# Patient Record
Sex: Female | Born: 1960 | Race: White | Hispanic: No | Marital: Married | Smoking: Never smoker
Health system: Southern US, Community
[De-identification: ages and names within clinical notes are randomized; demographics above are authoritative.]

## PROBLEM LIST (undated history)

## (undated) DIAGNOSIS — Z803 Family history of malignant neoplasm of breast: Secondary | ICD-10-CM

## (undated) DIAGNOSIS — Z800 Family history of malignant neoplasm of digestive organs: Secondary | ICD-10-CM

## (undated) HISTORY — DX: Family history of malignant neoplasm of breast: Z80.3

## (undated) HISTORY — PX: NM RENAL LASIX (ARMC HX): HXRAD1213

## (undated) HISTORY — DX: Family history of malignant neoplasm of digestive organs: Z80.0

## (undated) HISTORY — PX: BREAST SURGERY: SHX581

---

## 2000-04-16 ENCOUNTER — Other Ambulatory Visit: Admission: RE | Admit: 2000-04-16 | Discharge: 2000-04-16 | Payer: Self-pay | Admitting: Family Medicine

## 2000-04-28 ENCOUNTER — Encounter: Payer: Self-pay | Admitting: Family Medicine

## 2000-04-28 ENCOUNTER — Encounter: Admission: RE | Admit: 2000-04-28 | Discharge: 2000-04-28 | Payer: Self-pay | Admitting: Family Medicine

## 2000-05-04 ENCOUNTER — Encounter: Payer: Self-pay | Admitting: Family Medicine

## 2000-05-04 ENCOUNTER — Encounter: Admission: RE | Admit: 2000-05-04 | Discharge: 2000-05-04 | Payer: Self-pay | Admitting: Family Medicine

## 2001-05-26 ENCOUNTER — Encounter: Payer: Self-pay | Admitting: Family Medicine

## 2001-05-26 ENCOUNTER — Encounter: Admission: RE | Admit: 2001-05-26 | Discharge: 2001-05-26 | Payer: Self-pay | Admitting: Family Medicine

## 2001-06-09 ENCOUNTER — Encounter: Payer: Self-pay | Admitting: Family Medicine

## 2001-06-09 ENCOUNTER — Encounter: Admission: RE | Admit: 2001-06-09 | Discharge: 2001-06-09 | Payer: Self-pay | Admitting: Family Medicine

## 2001-09-09 ENCOUNTER — Encounter: Payer: Self-pay | Admitting: Family Medicine

## 2001-09-09 ENCOUNTER — Encounter: Admission: RE | Admit: 2001-09-09 | Discharge: 2001-09-09 | Payer: Self-pay | Admitting: Family Medicine

## 2002-04-15 ENCOUNTER — Encounter: Admission: RE | Admit: 2002-04-15 | Discharge: 2002-04-15 | Payer: Self-pay | Admitting: Family Medicine

## 2002-04-15 ENCOUNTER — Encounter: Payer: Self-pay | Admitting: Family Medicine

## 2002-04-25 ENCOUNTER — Other Ambulatory Visit: Admission: RE | Admit: 2002-04-25 | Discharge: 2002-04-25 | Payer: Self-pay | Admitting: Family Medicine

## 2002-05-10 ENCOUNTER — Encounter: Payer: Self-pay | Admitting: Family Medicine

## 2002-05-10 ENCOUNTER — Encounter: Admission: RE | Admit: 2002-05-10 | Discharge: 2002-05-10 | Payer: Self-pay | Admitting: Family Medicine

## 2003-05-04 ENCOUNTER — Other Ambulatory Visit: Admission: RE | Admit: 2003-05-04 | Discharge: 2003-05-04 | Payer: Self-pay | Admitting: Family Medicine

## 2003-07-07 ENCOUNTER — Encounter: Admission: RE | Admit: 2003-07-07 | Discharge: 2003-07-07 | Payer: Self-pay | Admitting: Family Medicine

## 2004-05-06 ENCOUNTER — Other Ambulatory Visit: Admission: RE | Admit: 2004-05-06 | Discharge: 2004-05-06 | Payer: Self-pay | Admitting: Family Medicine

## 2004-08-07 ENCOUNTER — Encounter: Admission: RE | Admit: 2004-08-07 | Discharge: 2004-08-07 | Payer: Self-pay | Admitting: Family Medicine

## 2005-06-30 ENCOUNTER — Other Ambulatory Visit: Admission: RE | Admit: 2005-06-30 | Discharge: 2005-06-30 | Payer: Self-pay | Admitting: Family Medicine

## 2005-08-08 ENCOUNTER — Encounter: Admission: RE | Admit: 2005-08-08 | Discharge: 2005-08-08 | Payer: Self-pay | Admitting: Family Medicine

## 2005-09-01 ENCOUNTER — Encounter: Admission: RE | Admit: 2005-09-01 | Discharge: 2005-09-01 | Payer: Self-pay | Admitting: Sports Medicine

## 2006-02-05 ENCOUNTER — Encounter: Admission: RE | Admit: 2006-02-05 | Discharge: 2006-02-05 | Payer: Self-pay | Admitting: Family Medicine

## 2006-03-24 HISTORY — PX: LAPAROSCOPIC HYSTERECTOMY: SHX1926

## 2006-06-01 ENCOUNTER — Ambulatory Visit (HOSPITAL_COMMUNITY): Admission: RE | Admit: 2006-06-01 | Discharge: 2006-06-02 | Payer: Self-pay | Admitting: Gynecology

## 2006-06-01 ENCOUNTER — Encounter (INDEPENDENT_AMBULATORY_CARE_PROVIDER_SITE_OTHER): Payer: Self-pay | Admitting: Specialist

## 2006-12-18 ENCOUNTER — Encounter: Admission: RE | Admit: 2006-12-18 | Discharge: 2006-12-18 | Payer: Self-pay | Admitting: Family Medicine

## 2008-05-08 ENCOUNTER — Encounter: Admission: RE | Admit: 2008-05-08 | Discharge: 2008-05-08 | Payer: Self-pay | Admitting: Family Medicine

## 2008-05-15 ENCOUNTER — Encounter: Admission: RE | Admit: 2008-05-15 | Discharge: 2008-05-15 | Payer: Self-pay | Admitting: Family Medicine

## 2008-06-13 ENCOUNTER — Other Ambulatory Visit: Admission: RE | Admit: 2008-06-13 | Discharge: 2008-06-13 | Payer: Self-pay | Admitting: Family Medicine

## 2008-09-12 ENCOUNTER — Encounter: Admission: RE | Admit: 2008-09-12 | Discharge: 2008-09-12 | Payer: Self-pay | Admitting: General Surgery

## 2009-07-19 ENCOUNTER — Encounter: Admission: RE | Admit: 2009-07-19 | Discharge: 2009-07-19 | Payer: Self-pay | Admitting: Family Medicine

## 2010-04-14 ENCOUNTER — Encounter: Payer: Self-pay | Admitting: Family Medicine

## 2010-07-30 ENCOUNTER — Other Ambulatory Visit: Payer: Self-pay | Admitting: Family Medicine

## 2010-07-30 DIAGNOSIS — Z1231 Encounter for screening mammogram for malignant neoplasm of breast: Secondary | ICD-10-CM

## 2010-08-09 NOTE — Discharge Summary (Signed)
NAMEALLANNAH, Linda Jacobson            ACCOUNT NO.:  [id number]   MEDICAL RECORD NO.:  [id number]          PATIENT TYPE:  OIB   LOCATION:  9317                          FACILITY:  [facility]   PHYSICIAN:  Timothy P. Fontaine, M.D.DATE OF BIRTH:  Apr 09, 1960   DATE OF ADMISSION:  06/01/2006  DATE OF DISCHARGE:  06/02/2006                               DISCHARGE SUMMARY   DISCHARGE DIAGNOSES:  Leiomyoma, menorrhagia, dysmenorrhea, irregular  menses.   PROCEDURE:  Laparoscopic-assisted vaginal hysterectomy, June 01, 2006,  pathology pending.   HOSPITAL COURSE:  The patient underwent an uncomplicated LAVH June 01, 2006.  Her postoperative course was uncomplicated.  She was discharged  on postoperative day #1 ambulating well, tolerating a regular diet,  voiding without difficulty with a postoperative hemoglobin of 9.8.  The  patient received precautions, instructions and follow-up and is to be  seen in the office in 2 weeks following discharge. She received a  prescription for Tylox #20 one to two p.o. q.4-6h. p.r.n. pain.      Timothy P. Fontaine, M.D.  Electronically Signed     TPF/MEDQ  D:  06/02/2006  T:  06/02/2006  Job:  [id number]

## 2010-08-09 NOTE — H&P (Signed)
Linda Jacobson, Linda Jacobson            ACCOUNT NO.:  [id number]   MEDICAL RECORD NO.:  [id number]          PATIENT TYPE:  OUT   LOCATION:  SDC                           FACILITY:  [facility]   PHYSICIAN:  Timothy P. Fontaine, M.D.DATE OF BIRTH:  1960-04-30   DATE OF ADMISSION:  05/15/2006  DATE OF DISCHARGE:                              HISTORY & PHYSICAL   CHIEF COMPLAINT:  Irregular, heavy, painful menses.   HISTORY OF PRESENT ILLNESS:  A 50 year old G1, P25 female currently on  Ortho Tri-Cyclen oral contraceptives presents with progressively  worsening periods with increasing cramping and bleeding.  Outpatient  evaluation includes a normal prolactin thyroid panel, hemoglobin of 12,  and an ultrasound which shows multiple small leiomyomata.  She is  admitted at this time for laparoscopic-assisted vaginal hysterectomy.   PAST MEDICAL HISTORY:  Uncomplicated.   PAST SURGICAL HISTORY:  Cesarean section.   CURRENT MEDICATIONS:  Ortho Tri-Cyclen.   ALLERGIES:  NONE.   REVIEW OF SYSTEMS:  Noncontributory.   FAMILY HISTORY:  Noncontributory.   SOCIAL HISTORY:  Noncontributory.   PHYSICAL EXAMINATION:  VITAL SIGNS:  Afebrile, vital signs are stable.  HEENT:  Normal.  LUNGS:  Clear.  CARDIAC:  Regular rate without rubs, murmurs or gallops.  ABDOMEN:  Benign.  PELVIC:  External BUS, vagina normal.  Cervix grossly normal.  Uterus is  retroverted, mildly enlarged, irregular consistent with myomas.  Adnexa  without gross masses or tenderness.   ASSESSMENT:  A 50 year old, gravida 1 para 1 female, multiple myomas  increasing dysmenorrhea, menorrhagia, irregular menses on oral  contraceptives.   Counseled as to the options and elects for hysterectomy.  Various routes  were reviewed.  The patient elects for attempted LAVH.  The patient  understands that any time during the procedure that we may convert the  laparoscopic approach to an abdominal approach either due to  difficulties  technically or complications.  She understands and accepts  this.  The issues of ovarian conservation were also reviewed with her,  options of keeping her ovaries or removing them were reviewed and  offered.  The patient prefers to keep both ovaries.  She understands and  at the time of surgery, either due to complications or significant  disease, I may remove one or both ovaries, and she gives me permission  to do so.  She does though want to keep both ovaries.  She understands  the long-term issues with this, particularly the risk of ovarian cancer  later in life and she understands and accepts this.  The risk of benign  ovarian disease such as cysts or pain, re-operation also reviewed.  Sexuality following hysterectomy was also reviewed and the possibilities  for persistent orgasmic dysfunction or persistent dyspareunia following  the procedure was understood and accepted.  The patient understands  there is absolute irreversible sterility and she understands and accepts  this.  The acute intraoperative postoperative risks were reviewed and  the risks of infection both internal requiring prolonged antibiotics,  abscess formation requiring re-operation and abscess drainage such as  cuff abscess was discussed, understood and accepted.  Wound  complications were  also reviewed either the smaller laparoscopic  incisions or if an abdominal incision is necessary, than the larger  incisional complications was reviewed to include infection, seroma, or  hematomas requiring opening and draining of incisions, closure by  secondary intention, as well as long-term risks of hernia formation.  The risks of bleeding leading to hemorrhage necessitating transfusion  and the risks of transfusion were reviewed to include transfusion  reaction, hepatitis, HIV, mad cow disease, and other unknown entities.  The risk of inadvertent injury to internal organs including bowel,  bladder, ureters, vessels and nerves  necessitating major exploratory  reparative surgeries, future reparative surgeries including bowel  resection and ostomy formations, either immediately recognized, delay  recognized was all discussed, understood and accepted.  She again  understands that if any time during the procedure it is felt unsafe to  proceed laparoscopically or if complications arise, then we will switch  to a larger abdominal approach and she understands and accepts this.  The patient's questions were answered to her satisfaction.  She is ready  to proceed with surgery.      Timothy P. Fontaine, M.D.  Electronically Signed     TPF/MEDQ  D:  05/04/2006  T:  05/04/2006  Job:  [id number]

## 2010-08-09 NOTE — Op Note (Signed)
Linda Jacobson, Linda Jacobson            ACCOUNT NO.:  [id number]   MEDICAL RECORD NO.:  [id number]          PATIENT TYPE:  OUT   LOCATION:  [facility]C                           FACILITY:  [facility]   PHYSICIAN:  Timothy P. Fontaine, M.D.DATE OF BIRTH:  October 06, 1960   DATE OF PROCEDURE:  06/01/2006  DATE OF DISCHARGE:                               OPERATIVE REPORT   PREOPERATIVE DIAGNOSES:  1. Leiomyomata.  2. Menorrhagia.  3. Dysmenorrhea.  4. Irregular menses.   POSTOPERATIVE DIAGNOSES:  1. Leiomyomata.  2. Menorrhagia.  3. Dysmenorrhea.  4. Irregular menses.   PROCEDURE:  Laparoscopically-assisted vaginal hysterectomy.   SURGEON:  Timothy P. Fontaine, M.D.   ASSISTANT:  Rande Brunt. Eda Paschal, M.D.   ANESTHETIC:  General.   ESTIMATED BLOOD LOSS:  Approximately 150 mL.   COMPLICATIONS:  None.   SPECIMEN:  Uterus.   FINDINGS:  EUA, external BUS.  Vagina grossly normal.  Cervix normal.  Uterus retroverted and mildly enlarged.  Irregular adnexa without  masses.  Laparoscopic anterior cul-de-sac with mild vesicouterine  peritoneal fold adhesions.  Posterior cul-de-sac normal.  Uterus  retroverted, mildly enlarged, irregular in contour; consistent with  leiomyomata.  Right and left fallopian tubes normal length, caliber of  fimbriated ends.  Right and left ovaries grossly normal, free and  mobile.  Upper abdominal examination grossly normal.   PROCEDURE:  The patient was taken to the operating room and underwent  general anesthesia.  She was placed in the low dorsal lithotomy  position, received an abdominal perineal vaginal preparation with  Betadine solution.  The bladder was emptied with indwelling Foley  catheterization place in sterile technique.  EUA performed and a Hulka  tenaculum placed on the cervix.  The patient was draped in the usual  fashion.  A vertical infraumbilical incision was made.  The 11-12 direct  entry optical port was placed under direct visualization, and the  abdomen was subsequently insufflated without difficulty.  Right and left  5-mm suprapubic ports were then placed under direct visualization, after  transillumination for the vessels without difficulty.  Examination of  the pelvic organs and upper abdominal exam was carried out, with  findings noted above.  The right uterine ovarian pedicle was then  identified, grasped with the gyrus tripolar instrument. bipolar  cauterized up to a flow of 0, and sharply incised.  The right round  ligament was similarly transected with cautery, and subsequently the  parametrial planes were sharply developed.  The vesicouterine peritoneal  fold was then sharply incised to the midline.  A similar procedure was  carried out on the other side, meeting the vesicouterine fold at the  midline.  There was some minimal uterine vesicle  scarring noted, which  was relieved through sharp dissection.  Attention was then turned to the  vaginal portion of the procedure.   The patient was placed in a high dorsal lithotomy position.  The short  weighted speculum was placed within the vagina.  The Hulka tenaculum was  removed.  A regular tenaculum was placed on the cervix and the cervical  mucosa was circumferentially injected using 1% lidocaine with  epinephrine mixture (a total of 10 mL).  The cervical mucosa was then  sharply incised circumferentially and the paracervical plane sharply  developed.  The posterior cul-de-sac was then sharply entered without  difficulty.  A long weighted speculum was placed and subsequently the  right and left uterine uterosacral ligaments were identified, clamped,  cut and ligated using 0 Vicryl suture; tagged for future reference.  The  anterior bladder flap was sharply developed progressively, and the  paracervical parametrial tissues were clamped, cut and ligated using 0  Vicryl suture.  Right and left uterine vessels were identified during  this process, and ligated using 0 Vicryl  suture.  Due to the  vesicouterine scarring, it was felt safer to deliver the uterus  posteriorly to clearly identify the anterior cul-de-sac before entry.  The uterus was morcellated posteriorly, removing multiple smaller  fibroids to allow delivery of the fundus posteriorly.  With a finger  anteriorly, the vesicouterine fold was clearly identified, transected  and the anterior cul-de-sac entered.  The uterus was then removed from  the patient, through clamping of the remaining tissue attachments and  ligating using 0 Vicryl suture.  The long weighted speculum was replaced  with a shorter weighted speculum.  The posterior vaginal cuff grasped  with an Allis clamp.  The pelvis irrigated.  Adequate hemostasis was  visualized initially and a tail sponge was placed to hold the intestines  from the operative field.  The posterior vaginal cuff was then run from  uterosacral ligament to uterosacral ligament using 0 Vicryl suture in a  running interlocking stitch.  The bowel packing removed and subsequently  the vagina closed anterior to posterior, using l0 Vicryl suture in  interrupted figure-of-eight stitch.  The instruments were removed.  The  vagina copiously irrigated.   Attention was then turned back to the laparoscopic portion of the case.  The patient was placed in the low dorsal lithotomy position.  The  surgeon and assistants regloved, and the abdomen was reinsufflated.  The  pelvis was copiously irrigated.  All pedicles were reinspected, showing  adequate hemostasis.  Subsequently the gas was allowed to escape slowly,  showing adequate hemostasis under a low pressure situation.  The 5-mm  ports were removed.  Again, hemostasis visualized.  The infraumbilical  port was then backed out under direct visualization, showing adequate  hemostasis and no evidence of hernia formation.  All port sites were injected using 0.25% Marcaine.  A 0 Vicryl interrupted fascial  subcutaneous stitch was  placed infraumbilically to close the site; and,  due to some oozing of the skin incisions, 3-0 Monocryl was used.  Interrupted continuous stitches used to close both the 5 mm and the 12-  mm ports.  The patient was placed in the supine position and awakened  without difficulty.  She was taken to recovery room in good condition,  having tolerated the procedure well.      Timothy P. Fontaine, M.D.  Electronically Signed     TPF/MEDQ  D:  06/01/2006  T:  06/01/2006  Job:  [id number]

## 2010-08-23 ENCOUNTER — Ambulatory Visit
Admission: RE | Admit: 2010-08-23 | Discharge: 2010-08-23 | Disposition: A | Discharge status: Home / Self Care | Payer: BC Managed Care – PPO | Source: Ambulatory Visit | Attending: Family Medicine | Admitting: Family Medicine

## 2010-08-23 DIAGNOSIS — Z1231 Encounter for screening mammogram for malignant neoplasm of breast: Secondary | ICD-10-CM

## 2011-09-09 ENCOUNTER — Other Ambulatory Visit: Payer: Self-pay | Admitting: Family Medicine

## 2011-09-09 DIAGNOSIS — Z1231 Encounter for screening mammogram for malignant neoplasm of breast: Secondary | ICD-10-CM

## 2011-09-22 ENCOUNTER — Ambulatory Visit
Admission: RE | Admit: 2011-09-22 | Discharge: 2011-09-22 | Disposition: A | Discharge status: Home / Self Care | Payer: BC Managed Care – PPO | Source: Ambulatory Visit | Attending: Family Medicine | Admitting: Family Medicine

## 2011-09-22 DIAGNOSIS — Z1231 Encounter for screening mammogram for malignant neoplasm of breast: Secondary | ICD-10-CM

## 2012-08-23 ENCOUNTER — Other Ambulatory Visit: Payer: Self-pay

## 2012-08-23 DIAGNOSIS — Z1231 Encounter for screening mammogram for malignant neoplasm of breast: Secondary | ICD-10-CM

## 2012-10-04 ENCOUNTER — Ambulatory Visit
Admission: RE | Admit: 2012-10-04 | Discharge: 2012-10-04 | Disposition: A | Discharge status: Home / Self Care | Payer: BC Managed Care – PPO | Source: Ambulatory Visit

## 2012-10-04 DIAGNOSIS — Z1231 Encounter for screening mammogram for malignant neoplasm of breast: Secondary | ICD-10-CM

## 2014-02-27 ENCOUNTER — Other Ambulatory Visit: Payer: Self-pay

## 2014-02-27 DIAGNOSIS — Z1231 Encounter for screening mammogram for malignant neoplasm of breast: Secondary | ICD-10-CM

## 2014-03-21 ENCOUNTER — Ambulatory Visit
Admission: RE | Admit: 2014-03-21 | Discharge: 2014-03-21 | Disposition: A | Discharge status: Home / Self Care | Payer: BC Managed Care – PPO | Source: Ambulatory Visit

## 2014-03-21 DIAGNOSIS — Z1231 Encounter for screening mammogram for malignant neoplasm of breast: Secondary | ICD-10-CM

## 2015-06-15 ENCOUNTER — Other Ambulatory Visit: Payer: Self-pay

## 2015-06-15 DIAGNOSIS — Z1231 Encounter for screening mammogram for malignant neoplasm of breast: Secondary | ICD-10-CM

## 2015-07-02 ENCOUNTER — Ambulatory Visit
Admission: RE | Admit: 2015-07-02 | Discharge: 2015-07-02 | Disposition: A | Discharge status: Home / Self Care | Payer: BLUE CROSS/BLUE SHIELD | Source: Ambulatory Visit

## 2015-07-02 DIAGNOSIS — Z1231 Encounter for screening mammogram for malignant neoplasm of breast: Secondary | ICD-10-CM | POA: Diagnosis not present

## 2015-07-27 DIAGNOSIS — Z01818 Encounter for other preprocedural examination: Secondary | ICD-10-CM | POA: Diagnosis not present

## 2015-09-07 ENCOUNTER — Other Ambulatory Visit: Payer: Self-pay | Admitting: Gastroenterology

## 2015-09-07 DIAGNOSIS — K644 Residual hemorrhoidal skin tags: Secondary | ICD-10-CM | POA: Diagnosis not present

## 2015-09-07 DIAGNOSIS — D122 Benign neoplasm of ascending colon: Secondary | ICD-10-CM | POA: Diagnosis not present

## 2015-09-07 DIAGNOSIS — Z1211 Encounter for screening for malignant neoplasm of colon: Secondary | ICD-10-CM | POA: Diagnosis not present

## 2015-09-07 DIAGNOSIS — K648 Other hemorrhoids: Secondary | ICD-10-CM | POA: Diagnosis not present

## 2015-09-07 DIAGNOSIS — K5730 Diverticulosis of large intestine without perforation or abscess without bleeding: Secondary | ICD-10-CM | POA: Diagnosis not present

## 2015-10-23 DIAGNOSIS — H16002 Unspecified corneal ulcer, left eye: Secondary | ICD-10-CM | POA: Diagnosis not present

## 2015-10-23 DIAGNOSIS — H04123 Dry eye syndrome of bilateral lacrimal glands: Secondary | ICD-10-CM | POA: Diagnosis not present

## 2015-10-24 DIAGNOSIS — H168 Other keratitis: Secondary | ICD-10-CM | POA: Diagnosis not present

## 2015-10-26 DIAGNOSIS — H16002 Unspecified corneal ulcer, left eye: Secondary | ICD-10-CM | POA: Diagnosis not present

## 2015-10-26 DIAGNOSIS — H04123 Dry eye syndrome of bilateral lacrimal glands: Secondary | ICD-10-CM | POA: Diagnosis not present

## 2015-10-26 DIAGNOSIS — H168 Other keratitis: Secondary | ICD-10-CM | POA: Diagnosis not present

## 2015-10-29 DIAGNOSIS — Z9889 Other specified postprocedural states: Secondary | ICD-10-CM | POA: Insufficient documentation

## 2015-10-29 DIAGNOSIS — H168 Other keratitis: Secondary | ICD-10-CM | POA: Diagnosis not present

## 2015-10-29 DIAGNOSIS — H5213 Myopia, bilateral: Secondary | ICD-10-CM | POA: Diagnosis not present

## 2015-10-29 DIAGNOSIS — H04123 Dry eye syndrome of bilateral lacrimal glands: Secondary | ICD-10-CM | POA: Diagnosis not present

## 2015-10-29 DIAGNOSIS — H5989 Other postprocedural complications and disorders of eye and adnexa, not elsewhere classified: Secondary | ICD-10-CM | POA: Insufficient documentation

## 2016-06-12 DIAGNOSIS — G43909 Migraine, unspecified, not intractable, without status migrainosus: Secondary | ICD-10-CM | POA: Diagnosis not present

## 2016-06-12 DIAGNOSIS — N951 Menopausal and female climacteric states: Secondary | ICD-10-CM | POA: Diagnosis not present

## 2016-06-12 DIAGNOSIS — Z0000 Encounter for general adult medical examination without abnormal findings: Secondary | ICD-10-CM | POA: Diagnosis not present

## 2016-06-16 DIAGNOSIS — M531 Cervicobrachial syndrome: Secondary | ICD-10-CM | POA: Diagnosis not present

## 2016-06-16 DIAGNOSIS — M9901 Segmental and somatic dysfunction of cervical region: Secondary | ICD-10-CM | POA: Diagnosis not present

## 2016-06-16 DIAGNOSIS — M9902 Segmental and somatic dysfunction of thoracic region: Secondary | ICD-10-CM | POA: Diagnosis not present

## 2016-06-16 DIAGNOSIS — R51 Headache: Secondary | ICD-10-CM | POA: Diagnosis not present

## 2016-06-17 DIAGNOSIS — R51 Headache: Secondary | ICD-10-CM | POA: Diagnosis not present

## 2016-06-17 DIAGNOSIS — M531 Cervicobrachial syndrome: Secondary | ICD-10-CM | POA: Diagnosis not present

## 2016-06-17 DIAGNOSIS — M9901 Segmental and somatic dysfunction of cervical region: Secondary | ICD-10-CM | POA: Diagnosis not present

## 2016-06-17 DIAGNOSIS — M9902 Segmental and somatic dysfunction of thoracic region: Secondary | ICD-10-CM | POA: Diagnosis not present

## 2016-06-19 DIAGNOSIS — R51 Headache: Secondary | ICD-10-CM | POA: Diagnosis not present

## 2016-06-19 DIAGNOSIS — M9901 Segmental and somatic dysfunction of cervical region: Secondary | ICD-10-CM | POA: Diagnosis not present

## 2016-06-19 DIAGNOSIS — M9902 Segmental and somatic dysfunction of thoracic region: Secondary | ICD-10-CM | POA: Diagnosis not present

## 2016-06-19 DIAGNOSIS — M531 Cervicobrachial syndrome: Secondary | ICD-10-CM | POA: Diagnosis not present

## 2016-06-23 DIAGNOSIS — M9901 Segmental and somatic dysfunction of cervical region: Secondary | ICD-10-CM | POA: Diagnosis not present

## 2016-06-23 DIAGNOSIS — M9902 Segmental and somatic dysfunction of thoracic region: Secondary | ICD-10-CM | POA: Diagnosis not present

## 2016-06-23 DIAGNOSIS — R51 Headache: Secondary | ICD-10-CM | POA: Diagnosis not present

## 2016-06-23 DIAGNOSIS — M531 Cervicobrachial syndrome: Secondary | ICD-10-CM | POA: Diagnosis not present

## 2016-06-24 DIAGNOSIS — M9902 Segmental and somatic dysfunction of thoracic region: Secondary | ICD-10-CM | POA: Diagnosis not present

## 2016-06-24 DIAGNOSIS — M531 Cervicobrachial syndrome: Secondary | ICD-10-CM | POA: Diagnosis not present

## 2016-06-24 DIAGNOSIS — M9901 Segmental and somatic dysfunction of cervical region: Secondary | ICD-10-CM | POA: Diagnosis not present

## 2016-06-24 DIAGNOSIS — R51 Headache: Secondary | ICD-10-CM | POA: Diagnosis not present

## 2016-06-26 DIAGNOSIS — M531 Cervicobrachial syndrome: Secondary | ICD-10-CM | POA: Diagnosis not present

## 2016-06-26 DIAGNOSIS — M9902 Segmental and somatic dysfunction of thoracic region: Secondary | ICD-10-CM | POA: Diagnosis not present

## 2016-06-26 DIAGNOSIS — R51 Headache: Secondary | ICD-10-CM | POA: Diagnosis not present

## 2016-06-26 DIAGNOSIS — M9901 Segmental and somatic dysfunction of cervical region: Secondary | ICD-10-CM | POA: Diagnosis not present

## 2016-07-01 DIAGNOSIS — M9902 Segmental and somatic dysfunction of thoracic region: Secondary | ICD-10-CM | POA: Diagnosis not present

## 2016-07-01 DIAGNOSIS — M531 Cervicobrachial syndrome: Secondary | ICD-10-CM | POA: Diagnosis not present

## 2016-07-01 DIAGNOSIS — M9901 Segmental and somatic dysfunction of cervical region: Secondary | ICD-10-CM | POA: Diagnosis not present

## 2016-07-01 DIAGNOSIS — R51 Headache: Secondary | ICD-10-CM | POA: Diagnosis not present

## 2016-07-02 DIAGNOSIS — M9902 Segmental and somatic dysfunction of thoracic region: Secondary | ICD-10-CM | POA: Diagnosis not present

## 2016-07-02 DIAGNOSIS — M9901 Segmental and somatic dysfunction of cervical region: Secondary | ICD-10-CM | POA: Diagnosis not present

## 2016-07-02 DIAGNOSIS — M531 Cervicobrachial syndrome: Secondary | ICD-10-CM | POA: Diagnosis not present

## 2016-07-02 DIAGNOSIS — R51 Headache: Secondary | ICD-10-CM | POA: Diagnosis not present

## 2016-07-03 DIAGNOSIS — M9902 Segmental and somatic dysfunction of thoracic region: Secondary | ICD-10-CM | POA: Diagnosis not present

## 2016-07-03 DIAGNOSIS — M531 Cervicobrachial syndrome: Secondary | ICD-10-CM | POA: Diagnosis not present

## 2016-07-03 DIAGNOSIS — M9901 Segmental and somatic dysfunction of cervical region: Secondary | ICD-10-CM | POA: Diagnosis not present

## 2016-07-03 DIAGNOSIS — R51 Headache: Secondary | ICD-10-CM | POA: Diagnosis not present

## 2016-07-14 DIAGNOSIS — M9901 Segmental and somatic dysfunction of cervical region: Secondary | ICD-10-CM | POA: Diagnosis not present

## 2016-07-14 DIAGNOSIS — R51 Headache: Secondary | ICD-10-CM | POA: Diagnosis not present

## 2016-07-14 DIAGNOSIS — M9902 Segmental and somatic dysfunction of thoracic region: Secondary | ICD-10-CM | POA: Diagnosis not present

## 2016-07-14 DIAGNOSIS — M531 Cervicobrachial syndrome: Secondary | ICD-10-CM | POA: Diagnosis not present

## 2016-07-15 DIAGNOSIS — M531 Cervicobrachial syndrome: Secondary | ICD-10-CM | POA: Diagnosis not present

## 2016-07-15 DIAGNOSIS — M9902 Segmental and somatic dysfunction of thoracic region: Secondary | ICD-10-CM | POA: Diagnosis not present

## 2016-07-15 DIAGNOSIS — R51 Headache: Secondary | ICD-10-CM | POA: Diagnosis not present

## 2016-07-15 DIAGNOSIS — M9901 Segmental and somatic dysfunction of cervical region: Secondary | ICD-10-CM | POA: Diagnosis not present

## 2016-07-16 DIAGNOSIS — M9902 Segmental and somatic dysfunction of thoracic region: Secondary | ICD-10-CM | POA: Diagnosis not present

## 2016-07-16 DIAGNOSIS — M531 Cervicobrachial syndrome: Secondary | ICD-10-CM | POA: Diagnosis not present

## 2016-07-16 DIAGNOSIS — R51 Headache: Secondary | ICD-10-CM | POA: Diagnosis not present

## 2016-07-16 DIAGNOSIS — M9901 Segmental and somatic dysfunction of cervical region: Secondary | ICD-10-CM | POA: Diagnosis not present

## 2016-07-22 DIAGNOSIS — M531 Cervicobrachial syndrome: Secondary | ICD-10-CM | POA: Diagnosis not present

## 2016-07-22 DIAGNOSIS — M9901 Segmental and somatic dysfunction of cervical region: Secondary | ICD-10-CM | POA: Diagnosis not present

## 2016-07-22 DIAGNOSIS — R51 Headache: Secondary | ICD-10-CM | POA: Diagnosis not present

## 2016-07-22 DIAGNOSIS — M9902 Segmental and somatic dysfunction of thoracic region: Secondary | ICD-10-CM | POA: Diagnosis not present

## 2016-07-24 DIAGNOSIS — R51 Headache: Secondary | ICD-10-CM | POA: Diagnosis not present

## 2016-07-24 DIAGNOSIS — M531 Cervicobrachial syndrome: Secondary | ICD-10-CM | POA: Diagnosis not present

## 2016-07-24 DIAGNOSIS — M9902 Segmental and somatic dysfunction of thoracic region: Secondary | ICD-10-CM | POA: Diagnosis not present

## 2016-07-24 DIAGNOSIS — M9901 Segmental and somatic dysfunction of cervical region: Secondary | ICD-10-CM | POA: Diagnosis not present

## 2016-07-28 DIAGNOSIS — R51 Headache: Secondary | ICD-10-CM | POA: Diagnosis not present

## 2016-07-28 DIAGNOSIS — M531 Cervicobrachial syndrome: Secondary | ICD-10-CM | POA: Diagnosis not present

## 2016-07-28 DIAGNOSIS — M9902 Segmental and somatic dysfunction of thoracic region: Secondary | ICD-10-CM | POA: Diagnosis not present

## 2016-07-28 DIAGNOSIS — M9901 Segmental and somatic dysfunction of cervical region: Secondary | ICD-10-CM | POA: Diagnosis not present

## 2016-07-31 DIAGNOSIS — M9902 Segmental and somatic dysfunction of thoracic region: Secondary | ICD-10-CM | POA: Diagnosis not present

## 2016-07-31 DIAGNOSIS — R51 Headache: Secondary | ICD-10-CM | POA: Diagnosis not present

## 2016-07-31 DIAGNOSIS — M531 Cervicobrachial syndrome: Secondary | ICD-10-CM | POA: Diagnosis not present

## 2016-07-31 DIAGNOSIS — M9901 Segmental and somatic dysfunction of cervical region: Secondary | ICD-10-CM | POA: Diagnosis not present

## 2016-08-04 DIAGNOSIS — M9902 Segmental and somatic dysfunction of thoracic region: Secondary | ICD-10-CM | POA: Diagnosis not present

## 2016-08-04 DIAGNOSIS — R51 Headache: Secondary | ICD-10-CM | POA: Diagnosis not present

## 2016-08-04 DIAGNOSIS — M531 Cervicobrachial syndrome: Secondary | ICD-10-CM | POA: Diagnosis not present

## 2016-08-04 DIAGNOSIS — M9901 Segmental and somatic dysfunction of cervical region: Secondary | ICD-10-CM | POA: Diagnosis not present

## 2016-08-07 DIAGNOSIS — M9902 Segmental and somatic dysfunction of thoracic region: Secondary | ICD-10-CM | POA: Diagnosis not present

## 2016-08-07 DIAGNOSIS — M531 Cervicobrachial syndrome: Secondary | ICD-10-CM | POA: Diagnosis not present

## 2016-08-07 DIAGNOSIS — R51 Headache: Secondary | ICD-10-CM | POA: Diagnosis not present

## 2016-08-07 DIAGNOSIS — M9901 Segmental and somatic dysfunction of cervical region: Secondary | ICD-10-CM | POA: Diagnosis not present

## 2016-08-12 DIAGNOSIS — M531 Cervicobrachial syndrome: Secondary | ICD-10-CM | POA: Diagnosis not present

## 2016-08-12 DIAGNOSIS — M9902 Segmental and somatic dysfunction of thoracic region: Secondary | ICD-10-CM | POA: Diagnosis not present

## 2016-08-12 DIAGNOSIS — M9901 Segmental and somatic dysfunction of cervical region: Secondary | ICD-10-CM | POA: Diagnosis not present

## 2016-08-12 DIAGNOSIS — R51 Headache: Secondary | ICD-10-CM | POA: Diagnosis not present

## 2016-08-26 DIAGNOSIS — M9902 Segmental and somatic dysfunction of thoracic region: Secondary | ICD-10-CM | POA: Diagnosis not present

## 2016-08-26 DIAGNOSIS — M9901 Segmental and somatic dysfunction of cervical region: Secondary | ICD-10-CM | POA: Diagnosis not present

## 2016-08-26 DIAGNOSIS — M531 Cervicobrachial syndrome: Secondary | ICD-10-CM | POA: Diagnosis not present

## 2016-08-26 DIAGNOSIS — R51 Headache: Secondary | ICD-10-CM | POA: Diagnosis not present

## 2016-09-18 DIAGNOSIS — M9901 Segmental and somatic dysfunction of cervical region: Secondary | ICD-10-CM | POA: Diagnosis not present

## 2016-09-18 DIAGNOSIS — R51 Headache: Secondary | ICD-10-CM | POA: Diagnosis not present

## 2016-09-18 DIAGNOSIS — M531 Cervicobrachial syndrome: Secondary | ICD-10-CM | POA: Diagnosis not present

## 2016-09-18 DIAGNOSIS — M9902 Segmental and somatic dysfunction of thoracic region: Secondary | ICD-10-CM | POA: Diagnosis not present

## 2016-09-21 DIAGNOSIS — A084 Viral intestinal infection, unspecified: Secondary | ICD-10-CM | POA: Diagnosis not present

## 2016-09-21 DIAGNOSIS — E860 Dehydration: Secondary | ICD-10-CM | POA: Diagnosis not present

## 2016-10-15 DIAGNOSIS — R51 Headache: Secondary | ICD-10-CM | POA: Diagnosis not present

## 2016-10-15 DIAGNOSIS — M9902 Segmental and somatic dysfunction of thoracic region: Secondary | ICD-10-CM | POA: Diagnosis not present

## 2016-10-15 DIAGNOSIS — M9901 Segmental and somatic dysfunction of cervical region: Secondary | ICD-10-CM | POA: Diagnosis not present

## 2016-10-15 DIAGNOSIS — M531 Cervicobrachial syndrome: Secondary | ICD-10-CM | POA: Diagnosis not present

## 2016-11-03 DIAGNOSIS — M9902 Segmental and somatic dysfunction of thoracic region: Secondary | ICD-10-CM | POA: Diagnosis not present

## 2016-11-03 DIAGNOSIS — M531 Cervicobrachial syndrome: Secondary | ICD-10-CM | POA: Diagnosis not present

## 2016-11-03 DIAGNOSIS — R51 Headache: Secondary | ICD-10-CM | POA: Diagnosis not present

## 2016-11-03 DIAGNOSIS — M9901 Segmental and somatic dysfunction of cervical region: Secondary | ICD-10-CM | POA: Diagnosis not present

## 2016-11-10 DIAGNOSIS — M9901 Segmental and somatic dysfunction of cervical region: Secondary | ICD-10-CM | POA: Diagnosis not present

## 2016-11-10 DIAGNOSIS — R51 Headache: Secondary | ICD-10-CM | POA: Diagnosis not present

## 2016-11-10 DIAGNOSIS — M531 Cervicobrachial syndrome: Secondary | ICD-10-CM | POA: Diagnosis not present

## 2016-11-10 DIAGNOSIS — M9902 Segmental and somatic dysfunction of thoracic region: Secondary | ICD-10-CM | POA: Diagnosis not present

## 2016-11-25 DIAGNOSIS — M9902 Segmental and somatic dysfunction of thoracic region: Secondary | ICD-10-CM | POA: Diagnosis not present

## 2016-11-25 DIAGNOSIS — R51 Headache: Secondary | ICD-10-CM | POA: Diagnosis not present

## 2016-11-25 DIAGNOSIS — M9901 Segmental and somatic dysfunction of cervical region: Secondary | ICD-10-CM | POA: Diagnosis not present

## 2016-11-25 DIAGNOSIS — M531 Cervicobrachial syndrome: Secondary | ICD-10-CM | POA: Diagnosis not present

## 2016-12-02 DIAGNOSIS — M9901 Segmental and somatic dysfunction of cervical region: Secondary | ICD-10-CM | POA: Diagnosis not present

## 2016-12-02 DIAGNOSIS — M531 Cervicobrachial syndrome: Secondary | ICD-10-CM | POA: Diagnosis not present

## 2016-12-02 DIAGNOSIS — M9902 Segmental and somatic dysfunction of thoracic region: Secondary | ICD-10-CM | POA: Diagnosis not present

## 2016-12-02 DIAGNOSIS — R51 Headache: Secondary | ICD-10-CM | POA: Diagnosis not present

## 2016-12-09 DIAGNOSIS — M9901 Segmental and somatic dysfunction of cervical region: Secondary | ICD-10-CM | POA: Diagnosis not present

## 2016-12-09 DIAGNOSIS — M9902 Segmental and somatic dysfunction of thoracic region: Secondary | ICD-10-CM | POA: Diagnosis not present

## 2016-12-09 DIAGNOSIS — M531 Cervicobrachial syndrome: Secondary | ICD-10-CM | POA: Diagnosis not present

## 2016-12-09 DIAGNOSIS — R51 Headache: Secondary | ICD-10-CM | POA: Diagnosis not present

## 2016-12-17 DIAGNOSIS — R51 Headache: Secondary | ICD-10-CM | POA: Diagnosis not present

## 2016-12-17 DIAGNOSIS — M9902 Segmental and somatic dysfunction of thoracic region: Secondary | ICD-10-CM | POA: Diagnosis not present

## 2016-12-17 DIAGNOSIS — M531 Cervicobrachial syndrome: Secondary | ICD-10-CM | POA: Diagnosis not present

## 2016-12-17 DIAGNOSIS — M9901 Segmental and somatic dysfunction of cervical region: Secondary | ICD-10-CM | POA: Diagnosis not present

## 2016-12-24 DIAGNOSIS — M531 Cervicobrachial syndrome: Secondary | ICD-10-CM | POA: Diagnosis not present

## 2016-12-24 DIAGNOSIS — R51 Headache: Secondary | ICD-10-CM | POA: Diagnosis not present

## 2016-12-24 DIAGNOSIS — M9902 Segmental and somatic dysfunction of thoracic region: Secondary | ICD-10-CM | POA: Diagnosis not present

## 2016-12-24 DIAGNOSIS — M9901 Segmental and somatic dysfunction of cervical region: Secondary | ICD-10-CM | POA: Diagnosis not present

## 2016-12-29 DIAGNOSIS — M531 Cervicobrachial syndrome: Secondary | ICD-10-CM | POA: Diagnosis not present

## 2016-12-29 DIAGNOSIS — R51 Headache: Secondary | ICD-10-CM | POA: Diagnosis not present

## 2016-12-29 DIAGNOSIS — M9902 Segmental and somatic dysfunction of thoracic region: Secondary | ICD-10-CM | POA: Diagnosis not present

## 2016-12-29 DIAGNOSIS — M9901 Segmental and somatic dysfunction of cervical region: Secondary | ICD-10-CM | POA: Diagnosis not present

## 2017-01-02 DIAGNOSIS — N3000 Acute cystitis without hematuria: Secondary | ICD-10-CM | POA: Diagnosis not present

## 2017-01-02 DIAGNOSIS — R300 Dysuria: Secondary | ICD-10-CM | POA: Diagnosis not present

## 2017-01-07 DIAGNOSIS — M9901 Segmental and somatic dysfunction of cervical region: Secondary | ICD-10-CM | POA: Diagnosis not present

## 2017-01-07 DIAGNOSIS — M531 Cervicobrachial syndrome: Secondary | ICD-10-CM | POA: Diagnosis not present

## 2017-01-07 DIAGNOSIS — M9902 Segmental and somatic dysfunction of thoracic region: Secondary | ICD-10-CM | POA: Diagnosis not present

## 2017-01-07 DIAGNOSIS — R51 Headache: Secondary | ICD-10-CM | POA: Diagnosis not present

## 2017-01-19 ENCOUNTER — Other Ambulatory Visit: Payer: Self-pay | Admitting: Family Medicine

## 2017-01-19 DIAGNOSIS — Z139 Encounter for screening, unspecified: Secondary | ICD-10-CM

## 2017-01-22 DIAGNOSIS — R51 Headache: Secondary | ICD-10-CM | POA: Diagnosis not present

## 2017-01-22 DIAGNOSIS — M9901 Segmental and somatic dysfunction of cervical region: Secondary | ICD-10-CM | POA: Diagnosis not present

## 2017-01-22 DIAGNOSIS — M531 Cervicobrachial syndrome: Secondary | ICD-10-CM | POA: Diagnosis not present

## 2017-01-22 DIAGNOSIS — M9902 Segmental and somatic dysfunction of thoracic region: Secondary | ICD-10-CM | POA: Diagnosis not present

## 2017-02-05 DIAGNOSIS — M9902 Segmental and somatic dysfunction of thoracic region: Secondary | ICD-10-CM | POA: Diagnosis not present

## 2017-02-05 DIAGNOSIS — M531 Cervicobrachial syndrome: Secondary | ICD-10-CM | POA: Diagnosis not present

## 2017-02-05 DIAGNOSIS — M9901 Segmental and somatic dysfunction of cervical region: Secondary | ICD-10-CM | POA: Diagnosis not present

## 2017-02-05 DIAGNOSIS — R51 Headache: Secondary | ICD-10-CM | POA: Diagnosis not present

## 2017-02-16 ENCOUNTER — Ambulatory Visit
Admission: RE | Admit: 2017-02-16 | Discharge: 2017-02-16 | Disposition: A | Discharge status: Home / Self Care | Payer: BLUE CROSS/BLUE SHIELD | Source: Ambulatory Visit | Attending: Family Medicine | Admitting: Family Medicine

## 2017-02-16 DIAGNOSIS — Z1231 Encounter for screening mammogram for malignant neoplasm of breast: Secondary | ICD-10-CM | POA: Diagnosis not present

## 2017-02-16 DIAGNOSIS — Z139 Encounter for screening, unspecified: Secondary | ICD-10-CM

## 2017-02-26 DIAGNOSIS — M9901 Segmental and somatic dysfunction of cervical region: Secondary | ICD-10-CM | POA: Diagnosis not present

## 2017-02-26 DIAGNOSIS — R51 Headache: Secondary | ICD-10-CM | POA: Diagnosis not present

## 2017-02-26 DIAGNOSIS — M9902 Segmental and somatic dysfunction of thoracic region: Secondary | ICD-10-CM | POA: Diagnosis not present

## 2017-02-26 DIAGNOSIS — M531 Cervicobrachial syndrome: Secondary | ICD-10-CM | POA: Diagnosis not present

## 2017-03-12 DIAGNOSIS — R51 Headache: Secondary | ICD-10-CM | POA: Diagnosis not present

## 2017-03-12 DIAGNOSIS — M9901 Segmental and somatic dysfunction of cervical region: Secondary | ICD-10-CM | POA: Diagnosis not present

## 2017-03-12 DIAGNOSIS — M9902 Segmental and somatic dysfunction of thoracic region: Secondary | ICD-10-CM | POA: Diagnosis not present

## 2017-03-12 DIAGNOSIS — M531 Cervicobrachial syndrome: Secondary | ICD-10-CM | POA: Diagnosis not present

## 2017-04-02 DIAGNOSIS — M531 Cervicobrachial syndrome: Secondary | ICD-10-CM | POA: Diagnosis not present

## 2017-04-02 DIAGNOSIS — M9902 Segmental and somatic dysfunction of thoracic region: Secondary | ICD-10-CM | POA: Diagnosis not present

## 2017-04-02 DIAGNOSIS — M9901 Segmental and somatic dysfunction of cervical region: Secondary | ICD-10-CM | POA: Diagnosis not present

## 2017-04-02 DIAGNOSIS — R51 Headache: Secondary | ICD-10-CM | POA: Diagnosis not present

## 2017-05-04 DIAGNOSIS — M531 Cervicobrachial syndrome: Secondary | ICD-10-CM | POA: Diagnosis not present

## 2017-05-04 DIAGNOSIS — R51 Headache: Secondary | ICD-10-CM | POA: Diagnosis not present

## 2017-05-04 DIAGNOSIS — M9901 Segmental and somatic dysfunction of cervical region: Secondary | ICD-10-CM | POA: Diagnosis not present

## 2017-05-04 DIAGNOSIS — M9902 Segmental and somatic dysfunction of thoracic region: Secondary | ICD-10-CM | POA: Diagnosis not present

## 2017-05-25 DIAGNOSIS — M9901 Segmental and somatic dysfunction of cervical region: Secondary | ICD-10-CM | POA: Diagnosis not present

## 2017-05-25 DIAGNOSIS — R51 Headache: Secondary | ICD-10-CM | POA: Diagnosis not present

## 2017-05-25 DIAGNOSIS — M9902 Segmental and somatic dysfunction of thoracic region: Secondary | ICD-10-CM | POA: Diagnosis not present

## 2017-05-25 DIAGNOSIS — M531 Cervicobrachial syndrome: Secondary | ICD-10-CM | POA: Diagnosis not present

## 2017-06-15 DIAGNOSIS — M9902 Segmental and somatic dysfunction of thoracic region: Secondary | ICD-10-CM | POA: Diagnosis not present

## 2017-06-15 DIAGNOSIS — M531 Cervicobrachial syndrome: Secondary | ICD-10-CM | POA: Diagnosis not present

## 2017-06-15 DIAGNOSIS — M9901 Segmental and somatic dysfunction of cervical region: Secondary | ICD-10-CM | POA: Diagnosis not present

## 2017-06-15 DIAGNOSIS — R51 Headache: Secondary | ICD-10-CM | POA: Diagnosis not present

## 2017-06-22 DIAGNOSIS — H9190 Unspecified hearing loss, unspecified ear: Secondary | ICD-10-CM | POA: Diagnosis not present

## 2017-06-22 DIAGNOSIS — N951 Menopausal and female climacteric states: Secondary | ICD-10-CM | POA: Diagnosis not present

## 2017-06-22 DIAGNOSIS — Z0000 Encounter for general adult medical examination without abnormal findings: Secondary | ICD-10-CM | POA: Diagnosis not present

## 2017-06-22 DIAGNOSIS — G43909 Migraine, unspecified, not intractable, without status migrainosus: Secondary | ICD-10-CM | POA: Diagnosis not present

## 2017-07-06 ENCOUNTER — Other Ambulatory Visit: Payer: Self-pay | Admitting: Family Medicine

## 2017-07-06 DIAGNOSIS — D225 Melanocytic nevi of trunk: Secondary | ICD-10-CM | POA: Diagnosis not present

## 2017-07-06 DIAGNOSIS — M9902 Segmental and somatic dysfunction of thoracic region: Secondary | ICD-10-CM | POA: Diagnosis not present

## 2017-07-06 DIAGNOSIS — M531 Cervicobrachial syndrome: Secondary | ICD-10-CM | POA: Diagnosis not present

## 2017-07-06 DIAGNOSIS — M9901 Segmental and somatic dysfunction of cervical region: Secondary | ICD-10-CM | POA: Diagnosis not present

## 2017-07-06 DIAGNOSIS — R51 Headache: Secondary | ICD-10-CM | POA: Diagnosis not present

## 2017-07-27 DIAGNOSIS — M531 Cervicobrachial syndrome: Secondary | ICD-10-CM | POA: Diagnosis not present

## 2017-07-27 DIAGNOSIS — R51 Headache: Secondary | ICD-10-CM | POA: Diagnosis not present

## 2017-07-27 DIAGNOSIS — M9901 Segmental and somatic dysfunction of cervical region: Secondary | ICD-10-CM | POA: Diagnosis not present

## 2017-07-27 DIAGNOSIS — M9902 Segmental and somatic dysfunction of thoracic region: Secondary | ICD-10-CM | POA: Diagnosis not present

## 2017-08-24 DIAGNOSIS — R51 Headache: Secondary | ICD-10-CM | POA: Diagnosis not present

## 2017-08-24 DIAGNOSIS — M9901 Segmental and somatic dysfunction of cervical region: Secondary | ICD-10-CM | POA: Diagnosis not present

## 2017-08-24 DIAGNOSIS — M531 Cervicobrachial syndrome: Secondary | ICD-10-CM | POA: Diagnosis not present

## 2017-08-24 DIAGNOSIS — M9902 Segmental and somatic dysfunction of thoracic region: Secondary | ICD-10-CM | POA: Diagnosis not present

## 2017-10-05 DIAGNOSIS — M531 Cervicobrachial syndrome: Secondary | ICD-10-CM | POA: Diagnosis not present

## 2017-10-05 DIAGNOSIS — M9901 Segmental and somatic dysfunction of cervical region: Secondary | ICD-10-CM | POA: Diagnosis not present

## 2017-10-05 DIAGNOSIS — R51 Headache: Secondary | ICD-10-CM | POA: Diagnosis not present

## 2017-10-05 DIAGNOSIS — M9902 Segmental and somatic dysfunction of thoracic region: Secondary | ICD-10-CM | POA: Diagnosis not present

## 2017-10-09 DIAGNOSIS — R0789 Other chest pain: Secondary | ICD-10-CM | POA: Diagnosis not present

## 2017-11-02 DIAGNOSIS — M9901 Segmental and somatic dysfunction of cervical region: Secondary | ICD-10-CM | POA: Diagnosis not present

## 2017-11-02 DIAGNOSIS — M531 Cervicobrachial syndrome: Secondary | ICD-10-CM | POA: Diagnosis not present

## 2017-11-02 DIAGNOSIS — R51 Headache: Secondary | ICD-10-CM | POA: Diagnosis not present

## 2017-11-02 DIAGNOSIS — M9902 Segmental and somatic dysfunction of thoracic region: Secondary | ICD-10-CM | POA: Diagnosis not present

## 2017-12-07 DIAGNOSIS — M9902 Segmental and somatic dysfunction of thoracic region: Secondary | ICD-10-CM | POA: Diagnosis not present

## 2017-12-07 DIAGNOSIS — R51 Headache: Secondary | ICD-10-CM | POA: Diagnosis not present

## 2017-12-07 DIAGNOSIS — M9901 Segmental and somatic dysfunction of cervical region: Secondary | ICD-10-CM | POA: Diagnosis not present

## 2017-12-07 DIAGNOSIS — M5386 Other specified dorsopathies, lumbar region: Secondary | ICD-10-CM | POA: Diagnosis not present

## 2018-02-02 DIAGNOSIS — M9901 Segmental and somatic dysfunction of cervical region: Secondary | ICD-10-CM | POA: Diagnosis not present

## 2018-02-02 DIAGNOSIS — M9902 Segmental and somatic dysfunction of thoracic region: Secondary | ICD-10-CM | POA: Diagnosis not present

## 2018-02-02 DIAGNOSIS — R51 Headache: Secondary | ICD-10-CM | POA: Diagnosis not present

## 2018-02-02 DIAGNOSIS — M5386 Other specified dorsopathies, lumbar region: Secondary | ICD-10-CM | POA: Diagnosis not present

## 2018-03-02 DIAGNOSIS — M9902 Segmental and somatic dysfunction of thoracic region: Secondary | ICD-10-CM | POA: Diagnosis not present

## 2018-03-02 DIAGNOSIS — M9901 Segmental and somatic dysfunction of cervical region: Secondary | ICD-10-CM | POA: Diagnosis not present

## 2018-03-02 DIAGNOSIS — M5386 Other specified dorsopathies, lumbar region: Secondary | ICD-10-CM | POA: Diagnosis not present

## 2018-03-02 DIAGNOSIS — R51 Headache: Secondary | ICD-10-CM | POA: Diagnosis not present

## 2018-03-30 DIAGNOSIS — R51 Headache: Secondary | ICD-10-CM | POA: Diagnosis not present

## 2018-03-30 DIAGNOSIS — M5032 Other cervical disc degeneration, mid-cervical region, unspecified level: Secondary | ICD-10-CM | POA: Diagnosis not present

## 2018-03-30 DIAGNOSIS — M9902 Segmental and somatic dysfunction of thoracic region: Secondary | ICD-10-CM | POA: Diagnosis not present

## 2018-03-30 DIAGNOSIS — M9901 Segmental and somatic dysfunction of cervical region: Secondary | ICD-10-CM | POA: Diagnosis not present

## 2018-04-14 DIAGNOSIS — M9901 Segmental and somatic dysfunction of cervical region: Secondary | ICD-10-CM | POA: Diagnosis not present

## 2018-04-14 DIAGNOSIS — M9902 Segmental and somatic dysfunction of thoracic region: Secondary | ICD-10-CM | POA: Diagnosis not present

## 2018-04-14 DIAGNOSIS — R51 Headache: Secondary | ICD-10-CM | POA: Diagnosis not present

## 2018-04-14 DIAGNOSIS — M5032 Other cervical disc degeneration, mid-cervical region, unspecified level: Secondary | ICD-10-CM | POA: Diagnosis not present

## 2018-05-17 DIAGNOSIS — M9901 Segmental and somatic dysfunction of cervical region: Secondary | ICD-10-CM | POA: Diagnosis not present

## 2018-05-17 DIAGNOSIS — R51 Headache: Secondary | ICD-10-CM | POA: Diagnosis not present

## 2018-05-17 DIAGNOSIS — M9902 Segmental and somatic dysfunction of thoracic region: Secondary | ICD-10-CM | POA: Diagnosis not present

## 2018-05-17 DIAGNOSIS — M5032 Other cervical disc degeneration, mid-cervical region, unspecified level: Secondary | ICD-10-CM | POA: Diagnosis not present

## 2018-06-25 DIAGNOSIS — N951 Menopausal and female climacteric states: Secondary | ICD-10-CM | POA: Diagnosis not present

## 2018-06-25 DIAGNOSIS — G43909 Migraine, unspecified, not intractable, without status migrainosus: Secondary | ICD-10-CM | POA: Diagnosis not present

## 2018-06-25 DIAGNOSIS — K219 Gastro-esophageal reflux disease without esophagitis: Secondary | ICD-10-CM | POA: Diagnosis not present

## 2018-06-25 DIAGNOSIS — Z0000 Encounter for general adult medical examination without abnormal findings: Secondary | ICD-10-CM | POA: Diagnosis not present

## 2019-02-02 DIAGNOSIS — M9901 Segmental and somatic dysfunction of cervical region: Secondary | ICD-10-CM | POA: Diagnosis not present

## 2019-02-02 DIAGNOSIS — R519 Headache, unspecified: Secondary | ICD-10-CM | POA: Diagnosis not present

## 2019-02-02 DIAGNOSIS — M9902 Segmental and somatic dysfunction of thoracic region: Secondary | ICD-10-CM | POA: Diagnosis not present

## 2019-02-02 DIAGNOSIS — M5032 Other cervical disc degeneration, mid-cervical region, unspecified level: Secondary | ICD-10-CM | POA: Diagnosis not present

## 2019-02-15 DIAGNOSIS — M9901 Segmental and somatic dysfunction of cervical region: Secondary | ICD-10-CM | POA: Diagnosis not present

## 2019-02-15 DIAGNOSIS — M9902 Segmental and somatic dysfunction of thoracic region: Secondary | ICD-10-CM | POA: Diagnosis not present

## 2019-02-15 DIAGNOSIS — M5032 Other cervical disc degeneration, mid-cervical region, unspecified level: Secondary | ICD-10-CM | POA: Diagnosis not present

## 2019-02-15 DIAGNOSIS — R519 Headache, unspecified: Secondary | ICD-10-CM | POA: Diagnosis not present

## 2019-02-22 DIAGNOSIS — M9902 Segmental and somatic dysfunction of thoracic region: Secondary | ICD-10-CM | POA: Diagnosis not present

## 2019-02-22 DIAGNOSIS — R519 Headache, unspecified: Secondary | ICD-10-CM | POA: Diagnosis not present

## 2019-02-22 DIAGNOSIS — M9901 Segmental and somatic dysfunction of cervical region: Secondary | ICD-10-CM | POA: Diagnosis not present

## 2019-02-22 DIAGNOSIS — M5032 Other cervical disc degeneration, mid-cervical region, unspecified level: Secondary | ICD-10-CM | POA: Diagnosis not present

## 2019-03-01 DIAGNOSIS — M9901 Segmental and somatic dysfunction of cervical region: Secondary | ICD-10-CM | POA: Diagnosis not present

## 2019-03-01 DIAGNOSIS — R519 Headache, unspecified: Secondary | ICD-10-CM | POA: Diagnosis not present

## 2019-03-01 DIAGNOSIS — M9902 Segmental and somatic dysfunction of thoracic region: Secondary | ICD-10-CM | POA: Diagnosis not present

## 2019-03-01 DIAGNOSIS — M5032 Other cervical disc degeneration, mid-cervical region, unspecified level: Secondary | ICD-10-CM | POA: Diagnosis not present

## 2019-03-08 DIAGNOSIS — M5032 Other cervical disc degeneration, mid-cervical region, unspecified level: Secondary | ICD-10-CM | POA: Diagnosis not present

## 2019-03-08 DIAGNOSIS — M9902 Segmental and somatic dysfunction of thoracic region: Secondary | ICD-10-CM | POA: Diagnosis not present

## 2019-03-08 DIAGNOSIS — R519 Headache, unspecified: Secondary | ICD-10-CM | POA: Diagnosis not present

## 2019-03-08 DIAGNOSIS — M9901 Segmental and somatic dysfunction of cervical region: Secondary | ICD-10-CM | POA: Diagnosis not present

## 2019-03-29 DIAGNOSIS — M5032 Other cervical disc degeneration, mid-cervical region, unspecified level: Secondary | ICD-10-CM | POA: Diagnosis not present

## 2019-03-29 DIAGNOSIS — R519 Headache, unspecified: Secondary | ICD-10-CM | POA: Diagnosis not present

## 2019-03-29 DIAGNOSIS — M9901 Segmental and somatic dysfunction of cervical region: Secondary | ICD-10-CM | POA: Diagnosis not present

## 2019-03-29 DIAGNOSIS — M9902 Segmental and somatic dysfunction of thoracic region: Secondary | ICD-10-CM | POA: Diagnosis not present

## 2019-04-05 DIAGNOSIS — M9901 Segmental and somatic dysfunction of cervical region: Secondary | ICD-10-CM | POA: Diagnosis not present

## 2019-04-05 DIAGNOSIS — M5032 Other cervical disc degeneration, mid-cervical region, unspecified level: Secondary | ICD-10-CM | POA: Diagnosis not present

## 2019-04-05 DIAGNOSIS — M9902 Segmental and somatic dysfunction of thoracic region: Secondary | ICD-10-CM | POA: Diagnosis not present

## 2019-04-05 DIAGNOSIS — R519 Headache, unspecified: Secondary | ICD-10-CM | POA: Diagnosis not present

## 2019-05-03 DIAGNOSIS — R519 Headache, unspecified: Secondary | ICD-10-CM | POA: Diagnosis not present

## 2019-05-03 DIAGNOSIS — M9901 Segmental and somatic dysfunction of cervical region: Secondary | ICD-10-CM | POA: Diagnosis not present

## 2019-05-03 DIAGNOSIS — M9902 Segmental and somatic dysfunction of thoracic region: Secondary | ICD-10-CM | POA: Diagnosis not present

## 2019-05-03 DIAGNOSIS — M5032 Other cervical disc degeneration, mid-cervical region, unspecified level: Secondary | ICD-10-CM | POA: Diagnosis not present

## 2019-05-31 DIAGNOSIS — M9902 Segmental and somatic dysfunction of thoracic region: Secondary | ICD-10-CM | POA: Diagnosis not present

## 2019-05-31 DIAGNOSIS — M5032 Other cervical disc degeneration, mid-cervical region, unspecified level: Secondary | ICD-10-CM | POA: Diagnosis not present

## 2019-05-31 DIAGNOSIS — R519 Headache, unspecified: Secondary | ICD-10-CM | POA: Diagnosis not present

## 2019-05-31 DIAGNOSIS — M9901 Segmental and somatic dysfunction of cervical region: Secondary | ICD-10-CM | POA: Diagnosis not present

## 2019-06-10 ENCOUNTER — Other Ambulatory Visit: Payer: Self-pay | Admitting: Family Medicine

## 2019-06-10 DIAGNOSIS — Z1231 Encounter for screening mammogram for malignant neoplasm of breast: Secondary | ICD-10-CM

## 2019-06-15 ENCOUNTER — Other Ambulatory Visit: Payer: Self-pay

## 2019-06-15 ENCOUNTER — Ambulatory Visit
Admission: RE | Admit: 2019-06-15 | Discharge: 2019-06-15 | Disposition: A | Discharge status: Home / Self Care | Payer: BC Managed Care – PPO | Source: Ambulatory Visit

## 2019-06-15 DIAGNOSIS — Z1231 Encounter for screening mammogram for malignant neoplasm of breast: Secondary | ICD-10-CM

## 2019-06-17 ENCOUNTER — Other Ambulatory Visit: Payer: Self-pay | Admitting: Family Medicine

## 2019-06-17 DIAGNOSIS — R928 Other abnormal and inconclusive findings on diagnostic imaging of breast: Secondary | ICD-10-CM

## 2019-06-20 DIAGNOSIS — R519 Headache, unspecified: Secondary | ICD-10-CM | POA: Diagnosis not present

## 2019-06-20 DIAGNOSIS — M5032 Other cervical disc degeneration, mid-cervical region, unspecified level: Secondary | ICD-10-CM | POA: Diagnosis not present

## 2019-06-20 DIAGNOSIS — M9901 Segmental and somatic dysfunction of cervical region: Secondary | ICD-10-CM | POA: Diagnosis not present

## 2019-06-20 DIAGNOSIS — M9902 Segmental and somatic dysfunction of thoracic region: Secondary | ICD-10-CM | POA: Diagnosis not present

## 2019-06-22 ENCOUNTER — Ambulatory Visit
Admission: RE | Admit: 2019-06-22 | Discharge: 2019-06-22 | Disposition: A | Discharge status: Home / Self Care | Payer: BC Managed Care – PPO | Source: Ambulatory Visit | Attending: Family Medicine | Admitting: Family Medicine

## 2019-06-22 ENCOUNTER — Other Ambulatory Visit: Payer: Self-pay

## 2019-06-22 DIAGNOSIS — N6002 Solitary cyst of left breast: Secondary | ICD-10-CM | POA: Diagnosis not present

## 2019-06-22 DIAGNOSIS — R922 Inconclusive mammogram: Secondary | ICD-10-CM | POA: Diagnosis not present

## 2019-06-22 DIAGNOSIS — R928 Other abnormal and inconclusive findings on diagnostic imaging of breast: Secondary | ICD-10-CM

## 2019-06-27 DIAGNOSIS — Z0000 Encounter for general adult medical examination without abnormal findings: Secondary | ICD-10-CM | POA: Diagnosis not present

## 2019-06-27 DIAGNOSIS — Z131 Encounter for screening for diabetes mellitus: Secondary | ICD-10-CM | POA: Diagnosis not present

## 2019-06-27 DIAGNOSIS — Z13220 Encounter for screening for lipoid disorders: Secondary | ICD-10-CM | POA: Diagnosis not present

## 2019-07-02 ENCOUNTER — Ambulatory Visit: Payer: BC Managed Care – PPO

## 2019-07-11 DIAGNOSIS — R519 Headache, unspecified: Secondary | ICD-10-CM | POA: Diagnosis not present

## 2019-07-11 DIAGNOSIS — M9901 Segmental and somatic dysfunction of cervical region: Secondary | ICD-10-CM | POA: Diagnosis not present

## 2019-07-11 DIAGNOSIS — M5032 Other cervical disc degeneration, mid-cervical region, unspecified level: Secondary | ICD-10-CM | POA: Diagnosis not present

## 2019-07-11 DIAGNOSIS — M9902 Segmental and somatic dysfunction of thoracic region: Secondary | ICD-10-CM | POA: Diagnosis not present

## 2019-08-01 DIAGNOSIS — R519 Headache, unspecified: Secondary | ICD-10-CM | POA: Diagnosis not present

## 2019-08-01 DIAGNOSIS — M9901 Segmental and somatic dysfunction of cervical region: Secondary | ICD-10-CM | POA: Diagnosis not present

## 2019-08-01 DIAGNOSIS — M5032 Other cervical disc degeneration, mid-cervical region, unspecified level: Secondary | ICD-10-CM | POA: Diagnosis not present

## 2019-08-01 DIAGNOSIS — M9902 Segmental and somatic dysfunction of thoracic region: Secondary | ICD-10-CM | POA: Diagnosis not present

## 2019-08-13 ENCOUNTER — Ambulatory Visit: Payer: BC Managed Care – PPO | Attending: Internal Medicine

## 2019-08-13 DIAGNOSIS — Z23 Encounter for immunization: Secondary | ICD-10-CM

## 2019-08-13 NOTE — Progress Notes (Signed)
   Covid-19 Vaccination Clinic  Name:  SHEMIA BEVEL    MRN: [id number] DOB: 27-Oct-1960  08/13/2019  Ms. Tofte was observed post Covid-19 immunization for 15 minutes without incident. She was provided with Vaccine Information Sheet and instruction to access the V-Safe system.   Ms. Yim was instructed to call 911 with any severe reactions post vaccine: Marland Kitchen Difficulty breathing  . Swelling of face and throat  . A fast heartbeat  . A bad rash all over body  . Dizziness and weakness   Immunizations Administered    Name Date Dose VIS Date Route   Pfizer COVID-19 Vaccine 08/13/2019 11:12 AM 0.3 mL 05/18/2018 Intramuscular   Manufacturer: [employer], [employer]   Lot: [id number]   NDC: [address]-3

## 2019-08-29 DIAGNOSIS — M5032 Other cervical disc degeneration, mid-cervical region, unspecified level: Secondary | ICD-10-CM | POA: Diagnosis not present

## 2019-08-29 DIAGNOSIS — M9901 Segmental and somatic dysfunction of cervical region: Secondary | ICD-10-CM | POA: Diagnosis not present

## 2019-08-29 DIAGNOSIS — R519 Headache, unspecified: Secondary | ICD-10-CM | POA: Diagnosis not present

## 2019-08-29 DIAGNOSIS — M9902 Segmental and somatic dysfunction of thoracic region: Secondary | ICD-10-CM | POA: Diagnosis not present

## 2019-09-28 DIAGNOSIS — M9902 Segmental and somatic dysfunction of thoracic region: Secondary | ICD-10-CM | POA: Diagnosis not present

## 2019-09-28 DIAGNOSIS — M5032 Other cervical disc degeneration, mid-cervical region, unspecified level: Secondary | ICD-10-CM | POA: Diagnosis not present

## 2019-09-28 DIAGNOSIS — R519 Headache, unspecified: Secondary | ICD-10-CM | POA: Diagnosis not present

## 2019-09-28 DIAGNOSIS — M9901 Segmental and somatic dysfunction of cervical region: Secondary | ICD-10-CM | POA: Diagnosis not present

## 2019-10-05 ENCOUNTER — Emergency Department
Admission: EM | Admit: 2019-10-05 | Discharge: 2019-10-05 | Disposition: A | Discharge status: Home / Self Care | Payer: BC Managed Care – PPO | Source: Home / Self Care | Attending: Family Medicine | Admitting: Family Medicine

## 2019-10-05 ENCOUNTER — Other Ambulatory Visit: Payer: Self-pay

## 2019-10-05 ENCOUNTER — Emergency Department (INDEPENDENT_AMBULATORY_CARE_PROVIDER_SITE_OTHER): Payer: BC Managed Care – PPO

## 2019-10-05 DIAGNOSIS — M5412 Radiculopathy, cervical region: Secondary | ICD-10-CM | POA: Diagnosis not present

## 2019-10-05 DIAGNOSIS — R202 Paresthesia of skin: Secondary | ICD-10-CM

## 2019-10-05 DIAGNOSIS — R0789 Other chest pain: Secondary | ICD-10-CM

## 2019-10-05 DIAGNOSIS — M47812 Spondylosis without myelopathy or radiculopathy, cervical region: Secondary | ICD-10-CM | POA: Diagnosis not present

## 2019-10-05 MED ORDER — PREDNISONE 20 MG PO TABS
ORAL_TABLET | ORAL | 0 refills | Status: DC
Start: 2019-10-05 — End: 2020-09-07

## 2019-10-05 NOTE — ED Triage Notes (Signed)
Pt c/o chest tightness and RT arm tingling since yesterday. Also says radiates into neck and side of face. All started while on a work call. No hx of anxiety or panic attacks. Hx of migraines. Imitrex and tylenol this am.

## 2019-10-05 NOTE — ED Provider Notes (Signed)
[facility]    CSN: [id number] Arrival date & time: 10/05/19  1033      History   Chief Complaint Chief Complaint  Patient presents with  . Hand Pain    RT arm ache  . Chest Pain    HPI Linda Jacobson is a 59 y.o. female.   Patient reports that yesterday evening about 7pm she suddenly developed aching in her right arm and right neck, even radiating to her right face.  She recalls no injury or change in activities.  Today she felt tingling in her right arm to her right 4th and fifth fingers.  Today she also has developed a sensation of tightness in her bilateral chest radiating to her back, causing mild pain with inspiration.  She denies shortness of breath.  She states that she has had similar chest tightness four times in the past, with negative cardiac evaluation and diagnosed as panic attack.  The history is provided by the patient.    History reviewed. No pertinent past medical history.  There are no problems to display for this patient.   History reviewed. No pertinent surgical history.  OB History   No obstetric history on file.      Home Medications    Prior to Admission medications   Medication Sig Start Date End Date Taking? Authorizing Provider  estradiol (ESTRACE) 0.1 MG/GM vaginal cream Place vaginally. 10/03/19   [provider]  nortriptyline (PAMELOR) 10 MG capsule Take 10 mg by mouth 2 (two) times daily. 10/02/19   [provider]  predniSONE (DELTASONE) 20 MG tablet Take one tab by mouth twice daily for 4 days, then one daily. Take with food. 10/05/19   Lattie Haw, MD  SUMAtriptan (IMITREX) 100 MG tablet Take 100 mg by mouth as directed. 10/02/19   [provider]    Family History Family History  Problem Relation Age of Onset  . Breast cancer Sister 63  . Breast cancer Sister 26    Social History Social History   Tobacco Use  . Smoking status: Never Smoker  . Smokeless tobacco: Never Used   Vaping Use  . Vaping Use: Never used  Substance Use Topics  . Alcohol use: Yes    Comment: occ  . Drug use: Not on file     Allergies   Patient has no known allergies.   Review of Systems Review of Systems  Constitutional: Negative.   HENT: Negative.   Eyes: Negative.   Respiratory: Positive for chest tightness. Negative for cough, shortness of breath, wheezing and stridor.   Cardiovascular: Negative for palpitations and leg swelling.  Gastrointestinal: Negative.   Genitourinary: Negative.   Musculoskeletal: Positive for neck pain. Negative for arthralgias and joint swelling.  Skin: Negative.   Neurological: Positive for numbness. Negative for dizziness, tremors, seizures, syncope, facial asymmetry, speech difficulty, weakness, light-headedness and headaches.  Hematological: Negative for adenopathy.  All other systems reviewed and are negative.    Physical Exam Triage Vital Signs ED Triage Vitals  Enc Vitals Group     BP 10/05/19 1044 134/85     Pulse Rate 10/05/19 1044 88     Resp 10/05/19 1044 16     Temp 10/05/19 1044 98.6 F (37 C)     Temp Source 10/05/19 1044 Oral     SpO2 10/05/19 1044 96 %     Weight 10/05/19 1046 162 lb (73.5 kg)     Height 10/05/19 1046 5\' 4"  (1.626 m)  Head Circumference --      Peak Flow --      Pain Score 10/05/19 1201 0     Pain Loc --      Pain Edu? --      Excl. in GC? --    No data found.  Updated Vital Signs BP 134/85 (BP Location: Right Arm)   Pulse 88   Temp 98.6 F (37 C) (Oral)   Resp 16   Ht 5\' 4"  (1.626 m)   Wt 73.5 kg   SpO2 96%   BMI 27.81 kg/m   Visual Acuity Right Eye Distance:   Left Eye Distance:   Bilateral Distance:    Right Eye Near:   Left Eye Near:    Bilateral Near:     Physical Exam Vitals and nursing note reviewed.  Constitutional:      General: She is not in acute distress.    Appearance: She is not ill-appearing.  HENT:     Head: Normocephalic.     Right Ear: Tympanic membrane  normal.     Left Ear: Tympanic membrane normal.     Nose: Nose normal.     Mouth/Throat:     Pharynx: Oropharynx is clear.  Eyes:     Extraocular Movements: Extraocular movements intact.     Pupils: Pupils are equal, round, and reactive to light.  Neck:     Comments: Neck flexion to the left causes a sensation of tightness in her right shoulder. Cardiovascular:     Rate and Rhythm: Normal rate and regular rhythm.     Heart sounds: Normal heart sounds.  Pulmonary:     Breath sounds: Normal breath sounds.  Chest:     Chest wall: No tenderness.  Abdominal:     Palpations: Abdomen is soft.     Tenderness: There is no abdominal tenderness.  Musculoskeletal:     Cervical back: Normal range of motion. No rigidity or tenderness.     Right lower leg: No edema.     Left lower leg: No edema.  Lymphadenopathy:     Cervical: No cervical adenopathy.  Skin:    General: Skin is warm and dry.     Findings: No rash.  Neurological:     General: No focal deficit present.     Mental Status: She is alert and oriented to person, place, and time.     Cranial Nerves: No cranial nerve deficit.     Sensory: No sensory deficit.     Motor: No weakness.      UC Treatments / Results  Labs (all labs ordered are listed, but only abnormal results are displayed) Labs Reviewed - No data to display  EKG  Rate:  75 BPM PR:  176 msec QT:  400 msec QTcH:  446 msec QRSD:  86 msec QRS axis:  10 degrees Interpretation:  within normal limits; no acute changes   Radiology DG Cervical Spine Complete  Result Date: 10/05/2019 CLINICAL DATA:  Neck and right arm pain for the past day. No injury. EXAM: CERVICAL SPINE - COMPLETE 4+ VIEW COMPARISON:  None. FINDINGS: The lateral view is diagnostic to the C7-T1 level. There is no acute fracture or subluxation. Vertebral body heights are preserved. Reversal of the normal cervical lordosis. No listhesis. Mild disc height loss at C4-C5. Moderate disc height loss and  mild uncovertebral hypertrophy at C5-C6 and C6-C7. No significant bony neuroforaminal stenosis. Normal prevertebral soft tissues. IMPRESSION: 1. Multilevel cervical spondylosis, moderate at C5-C6 and C6-C7.  Electronically Signed   By: Obie Dredge M.D.   On: 10/05/2019 12:50    Procedures Procedures (including critical care time)  Medications Ordered in UC Medications - No data to display  Initial Impression / Assessment and Plan / UC Course  I have reviewed the triage vital signs and the nursing notes.  Pertinent labs & imaging results that were available during my care of the patient were reviewed by me and considered in my medical decision making (see chart for details).    Normal EKG reassuring. Suspect a component of anxiety causing her chest pain (she has had similar pain four times in the past). Dispensed soft cervical collar.  Begin prednisone burst/taper.  Followup with Dr. Rodney [facility] ([facility]) in about one week.    Final Clinical Impressions(s) / UC Diagnoses   Final diagnoses:  Paresthesia of right arm  Cervical radiculopathy  Atypical chest pain     Discharge Instructions     Wear soft cervical collar at bedtime.  Apply ice pack to back of neck for 20 to 30 minutes, 3 to 4 times daily  Continue until pain and decreases.  May take Tylenol as needed for pain.    ED Prescriptions    Medication Sig Dispense Auth. Provider   predniSONE (DELTASONE) 20 MG tablet Take one tab by mouth twice daily for 4 days, then one daily. Take with food. 12 tablet Lattie Haw, MD        Lattie Haw, MD 10/07/19 [phone]

## 2019-10-05 NOTE — Discharge Instructions (Addendum)
Wear soft cervical collar at bedtime.  Apply ice pack to back of neck for 20 to 30 minutes, 3 to 4 times daily  Continue until pain and decreases.  May take Tylenol as needed for pain.

## 2019-10-26 DIAGNOSIS — R519 Headache, unspecified: Secondary | ICD-10-CM | POA: Diagnosis not present

## 2019-10-26 DIAGNOSIS — M9902 Segmental and somatic dysfunction of thoracic region: Secondary | ICD-10-CM | POA: Diagnosis not present

## 2019-10-26 DIAGNOSIS — M9901 Segmental and somatic dysfunction of cervical region: Secondary | ICD-10-CM | POA: Diagnosis not present

## 2019-10-26 DIAGNOSIS — M5032 Other cervical disc degeneration, mid-cervical region, unspecified level: Secondary | ICD-10-CM | POA: Diagnosis not present

## 2019-11-14 DIAGNOSIS — F410 Panic disorder [episodic paroxysmal anxiety] without agoraphobia: Secondary | ICD-10-CM | POA: Diagnosis not present

## 2019-12-05 DIAGNOSIS — R519 Headache, unspecified: Secondary | ICD-10-CM | POA: Diagnosis not present

## 2019-12-05 DIAGNOSIS — M5032 Other cervical disc degeneration, mid-cervical region, unspecified level: Secondary | ICD-10-CM | POA: Diagnosis not present

## 2019-12-05 DIAGNOSIS — M9901 Segmental and somatic dysfunction of cervical region: Secondary | ICD-10-CM | POA: Diagnosis not present

## 2019-12-05 DIAGNOSIS — M9902 Segmental and somatic dysfunction of thoracic region: Secondary | ICD-10-CM | POA: Diagnosis not present

## 2020-01-09 DIAGNOSIS — R519 Headache, unspecified: Secondary | ICD-10-CM | POA: Diagnosis not present

## 2020-01-09 DIAGNOSIS — M9902 Segmental and somatic dysfunction of thoracic region: Secondary | ICD-10-CM | POA: Diagnosis not present

## 2020-01-09 DIAGNOSIS — M9901 Segmental and somatic dysfunction of cervical region: Secondary | ICD-10-CM | POA: Diagnosis not present

## 2020-01-09 DIAGNOSIS — M5032 Other cervical disc degeneration, mid-cervical region, unspecified level: Secondary | ICD-10-CM | POA: Diagnosis not present

## 2020-02-13 DIAGNOSIS — M9901 Segmental and somatic dysfunction of cervical region: Secondary | ICD-10-CM | POA: Diagnosis not present

## 2020-02-13 DIAGNOSIS — M5032 Other cervical disc degeneration, mid-cervical region, unspecified level: Secondary | ICD-10-CM | POA: Diagnosis not present

## 2020-02-13 DIAGNOSIS — M9902 Segmental and somatic dysfunction of thoracic region: Secondary | ICD-10-CM | POA: Diagnosis not present

## 2020-02-13 DIAGNOSIS — R519 Headache, unspecified: Secondary | ICD-10-CM | POA: Diagnosis not present

## 2020-03-06 DIAGNOSIS — D2262 Melanocytic nevi of left upper limb, including shoulder: Secondary | ICD-10-CM | POA: Diagnosis not present

## 2020-03-06 DIAGNOSIS — D1801 Hemangioma of skin and subcutaneous tissue: Secondary | ICD-10-CM | POA: Diagnosis not present

## 2020-03-06 DIAGNOSIS — D485 Neoplasm of uncertain behavior of skin: Secondary | ICD-10-CM | POA: Diagnosis not present

## 2020-03-06 DIAGNOSIS — L821 Other seborrheic keratosis: Secondary | ICD-10-CM | POA: Diagnosis not present

## 2020-03-06 DIAGNOSIS — D225 Melanocytic nevi of trunk: Secondary | ICD-10-CM | POA: Diagnosis not present

## 2020-03-20 DIAGNOSIS — U071 COVID-19: Secondary | ICD-10-CM | POA: Diagnosis not present

## 2020-04-19 DIAGNOSIS — L988 Other specified disorders of the skin and subcutaneous tissue: Secondary | ICD-10-CM | POA: Diagnosis not present

## 2020-04-19 DIAGNOSIS — D485 Neoplasm of uncertain behavior of skin: Secondary | ICD-10-CM | POA: Diagnosis not present

## 2020-06-12 DIAGNOSIS — M5032 Other cervical disc degeneration, mid-cervical region, unspecified level: Secondary | ICD-10-CM | POA: Diagnosis not present

## 2020-06-12 DIAGNOSIS — M9902 Segmental and somatic dysfunction of thoracic region: Secondary | ICD-10-CM | POA: Diagnosis not present

## 2020-06-12 DIAGNOSIS — R519 Headache, unspecified: Secondary | ICD-10-CM | POA: Diagnosis not present

## 2020-06-12 DIAGNOSIS — M9901 Segmental and somatic dysfunction of cervical region: Secondary | ICD-10-CM | POA: Diagnosis not present

## 2020-06-26 DIAGNOSIS — M9902 Segmental and somatic dysfunction of thoracic region: Secondary | ICD-10-CM | POA: Diagnosis not present

## 2020-06-26 DIAGNOSIS — M9901 Segmental and somatic dysfunction of cervical region: Secondary | ICD-10-CM | POA: Diagnosis not present

## 2020-06-26 DIAGNOSIS — R519 Headache, unspecified: Secondary | ICD-10-CM | POA: Diagnosis not present

## 2020-06-26 DIAGNOSIS — M5032 Other cervical disc degeneration, mid-cervical region, unspecified level: Secondary | ICD-10-CM | POA: Diagnosis not present

## 2020-06-27 DIAGNOSIS — Z13220 Encounter for screening for lipoid disorders: Secondary | ICD-10-CM | POA: Diagnosis not present

## 2020-06-27 DIAGNOSIS — Z131 Encounter for screening for diabetes mellitus: Secondary | ICD-10-CM | POA: Diagnosis not present

## 2020-06-27 DIAGNOSIS — Z0000 Encounter for general adult medical examination without abnormal findings: Secondary | ICD-10-CM | POA: Diagnosis not present

## 2020-07-02 ENCOUNTER — Telehealth: Payer: Self-pay | Admitting: Genetic Counselor

## 2020-07-02 DIAGNOSIS — M9901 Segmental and somatic dysfunction of cervical region: Secondary | ICD-10-CM | POA: Diagnosis not present

## 2020-07-02 DIAGNOSIS — R519 Headache, unspecified: Secondary | ICD-10-CM | POA: Diagnosis not present

## 2020-07-02 DIAGNOSIS — M9902 Segmental and somatic dysfunction of thoracic region: Secondary | ICD-10-CM | POA: Diagnosis not present

## 2020-07-02 DIAGNOSIS — M5032 Other cervical disc degeneration, mid-cervical region, unspecified level: Secondary | ICD-10-CM | POA: Diagnosis not present

## 2020-07-02 NOTE — Telephone Encounter (Signed)
Received a genetic counseling referral from Dr. Ehinger for fhx of brca. Linda Jacobson has been cld and scheduled to see [name] on 4/28 at 3pm. Pt needed an appt late in the afternoon. Aware to arrive 15 minutes early. 

## 2020-07-17 DIAGNOSIS — M5032 Other cervical disc degeneration, mid-cervical region, unspecified level: Secondary | ICD-10-CM | POA: Diagnosis not present

## 2020-07-17 DIAGNOSIS — M9902 Segmental and somatic dysfunction of thoracic region: Secondary | ICD-10-CM | POA: Diagnosis not present

## 2020-07-17 DIAGNOSIS — R519 Headache, unspecified: Secondary | ICD-10-CM | POA: Diagnosis not present

## 2020-07-17 DIAGNOSIS — M9901 Segmental and somatic dysfunction of cervical region: Secondary | ICD-10-CM | POA: Diagnosis not present

## 2020-07-19 ENCOUNTER — Inpatient Hospital Stay: Payer: BC Managed Care – PPO

## 2020-07-19 ENCOUNTER — Other Ambulatory Visit: Payer: Self-pay

## 2020-07-19 ENCOUNTER — Inpatient Hospital Stay: Payer: BC Managed Care – PPO | Attending: Genetic Counselor | Admitting: Genetic Counselor

## 2020-07-19 DIAGNOSIS — Z800 Family history of malignant neoplasm of digestive organs: Secondary | ICD-10-CM | POA: Diagnosis not present

## 2020-07-19 DIAGNOSIS — Z8481 Family history of carrier of genetic disease: Secondary | ICD-10-CM

## 2020-07-19 DIAGNOSIS — Z803 Family history of malignant neoplasm of breast: Secondary | ICD-10-CM

## 2020-07-19 LAB — GENETIC SCREENING ORDER

## 2020-07-20 ENCOUNTER — Encounter: Payer: Self-pay | Admitting: Genetic Counselor

## 2020-07-20 DIAGNOSIS — Z800 Family history of malignant neoplasm of digestive organs: Secondary | ICD-10-CM | POA: Insufficient documentation

## 2020-07-20 DIAGNOSIS — Z8481 Family history of carrier of genetic disease: Secondary | ICD-10-CM

## 2020-07-20 DIAGNOSIS — Z803 Family history of malignant neoplasm of breast: Secondary | ICD-10-CM | POA: Insufficient documentation

## 2020-07-20 HISTORY — DX: Family history of carrier of genetic disease: Z84.81

## 2020-07-20 NOTE — Progress Notes (Signed)
REFERRING PROVIDER: Gaynelle Arabian, MD [address] [place],  [place] [address]  PRIMARY PROVIDER:  Gaynelle Arabian, MD  PRIMARY REASON FOR VISIT:  1. Family history of BRCA gene mutation   2. Family history of breast cancer   3. Family history of liver cancer       HISTORY OF PRESENT ILLNESS:   Ms. Berti, a 60 y.o. female, was seen for a  cancer genetics consultation at the request of Dr. Marisue Humble due to a family history of breast cancer and a known BRCA mutation.  Ms. Hundal presents to clinic today with her husband to discuss the possibility of a hereditary predisposition to cancer, genetic testing, and to further clarify her future cancer risks, as well as potential cancer risks for family members.   Ms. Heiney does not have a personal history of cancer.    RISK FACTORS:  Menarche was at age 24-13.  First live birth at age 61.  OCP use for approximately 27 years.  Ovaries intact: yes.  Hysterectomy: yes.  Menopausal status: postmenopausal.  HRT use: 0 years. Colonoscopy: yes; 08/2015. Mammogram within the last year: yes. Number of breast biopsies: 0. Any excessive radiation exposure in the past: no  Past Medical History:  Diagnosis Date  . Family history of BRCA gene mutation 07/20/2020  . Family history of breast cancer   . Family history of liver cancer     No past surgical history on file.  Social History   Socioeconomic History  . Marital status: Married    Spouse name: Not on file  . Number of children: Not on file  . Years of education: Not on file  . Highest education level: Not on file  Occupational History  . Not on file  Tobacco Use  . Smoking status: Never Smoker  . Smokeless tobacco: Never Used  Vaping Use  . Vaping Use: Never used  Substance and Sexual Activity  . Alcohol use: Yes    Comment: occ  . Drug use: Not on file  . Sexual activity: Not on file  Other Topics Concern  . Not on file  Social History  Narrative  . Not on file   Social Determinants of Health   Financial Resource Strain: Not on file  Food Insecurity: Not on file  Transportation Needs: Not on file  Physical Activity: Not on file  Stress: Not on file  Social Connections: Not on file     FAMILY HISTORY:  We obtained a detailed, 4-generation family history.  Significant diagnoses are listed below: Family History  Problem Relation Age of Onset  . Breast cancer Sister 27  . Breast cancer Sister 77  . Liver cancer Maternal Uncle        dx >50  . Breast cancer Maternal Grandmother        unknown age of diagnosis  . BRCA 1/2 Sister        BRCA positive  . Breast cancer Paternal Grandmother        unknown age of diagnosis   Ms. Musleh has one daughter (age 13). She has one brother and six sister. Two sisters had breast cancer - one diagnosed at age 52, the other diagnosed in her 75s. Another sister had genetic testing that revealed a BRCA mutation (report not available for review).  Ms. Morten's mother died in her late 60s without cancer. There was one maternal aunt and one maternal uncle. Her uncle was diagnosed with liver cancer older than 13.  There is no known cancer among maternal cousins. Ms. Williard's maternal grandmother died in her early 70s and had breast cancer (unknown age of diagnosis). Her maternal grandfather died in his late 64s without cancer.  Ms. Christensen's father died in his late 18s without cancer. There were two paternal aunts and multiple paternal uncles. There is no known cancer among paternal aunts/uncles or paternal cousins, although she has limited information about these family members. Ms. Persons's paternal grandmother had breast cancer at an unknown age. She does not have information about her paternal grandfather.  Ms. Jeng is aware of previous family history of genetic testing for hereditary cancer risks. Patient's ancestors are of unknown descent. Ms. Newhouse is unsure if  there is Ashkenazi Jewish ancestry, although she believes it is possible because her maternal grandmother's maiden name was [place]. There is no known consanguinity.  GENETIC COUNSELING ASSESSMENT: Ms. Arington is a 60 y.o. female with a family history of a known BRCA mutation and breast cancer. We, therefore, discussed and recommended the following at today's visit.   DISCUSSION:  We discussed that Ms. Jobin has up to a 50% (1 in 2) chance to also have the BRCA variant that was discovered in her sister. We reviewed the cancer risks that are associated with BRCA mutations, including an increased risk of breast cancer, ovarian cancer, prostate cancer, female breast cancer, melanoma, and pancreatic cancer. Individuals with a BRCA mutation may opt for increased cancer screening and/or risk-reduction strategies for associated cancer risks per the NCCN guidelines.  Ms. Magouirk reports that she had inconclusive genetic testing of the BRCA genes around 2006 or 2007 (records not available for review). This may reduce but does not eliminate the chance that she has the same mutation that was identified in her sister. Additionally, there are other genes known to increase breast cancer risk that she has not had testing for, and because mutations in these genes may impact medical management, it is appropriate to pursue updated genetic testing for other hereditary breast cancer genes.   We discussed that testing is beneficial for several reasons, including knowing about other cancer risks, identifying potential screening and risk-reduction options that may be appropriate, and to understand if other family members could be at risk for cancer and allow them to undergo genetic testing. We reviewed the characteristics, features and inheritance patterns of hereditary cancer syndromes. We also discussed genetic testing, including the appropriate family members to test, the process of testing, insurance coverage and  turn-around-time for results. We discussed the implications of a negative, positive and/or variant of uncertain significant result. We recommended Ms. Wethington pursue genetic testing for the [facility] CustomNext-Expanded + RNAinsight gene panel.   The CustomNext-Cancer+RNAinsight panel offered by [facility] includes sequencing and rearrangement analysis for the following 91 genes: AIP, ALK, APC, ATM, AXIN2, BAP1, BARD1, BLM, BMPR1A, BRCA1, BRCA2, BRIP1, CDC73, CDH1, CDK4, CDKN1B, CDKN2A, CHEK2, CTNNA1, DICER1, FANCC, FH, FLCN, GALNT12, KIF1B, LZTR1, MAX, MEN1, MET, MLH1, MRE11A, MSH2, MSH3, MSH6, MUTYH, NBN, NF1, NF2, NTHL1, PALB2, PHOX2B, PMS2, POT1, PRKAR1A, PTCH1, PTEN, RAD50, RAD51C, RAD51D, RB1, RECQL, RET, SDHA, SDHAF2, SDHB, SDHC, SDHD, SMAD4, SMARCA4, SMARCB1, SMARCE1, STK11, SUFU, TMEM127, Tp53, TSC1, TSC2, VHL and XRCC2 (sequencing and deletion/duplication); CASR, CFTR, CPA1, CTRC, EGFR, EGLN1, FAM175A, HOXB13, KIT, MITF, MLH3, PALLD, PDGFRA, POLD1, POLE, PRSS1, RINT1, RPS20, SPINK1 and TERT (sequencing only); EPCAM and GREM1 (deletion/duplication only). RNA data is routinely analyzed for use in variant interpretation for all genes.   Based on Ms. Kotter's family history of  cancer and a known BRCA mutation, she meets medical criteria for genetic testing. Despite that she meets criteria, there may still be an out of pocket cost. We discussed that if her out of pocket cost for testing is over $100, the laboratory will reach out to let her know. If the out of pocket cost of testing is less than $100 she will be billed by the genetic testing laboratory.   PLAN: After considering the risks, benefits, and limitations, Ms. Purkey provided informed consent to pursue genetic testing and the blood sample was sent to [facility] for analysis of the CustomNext-Cancer + RNAinsight panel. Results should be available within approximately two-three weeks' time, at which point they will be  disclosed by telephone to Ms. Yankowski, as will any additional recommendations warranted by these results. Ms. Shambaugh will receive a summary of her genetic counseling visit and a copy of her results once available. This information will also be available in Epic.   Ms. Zell's questions were answered to her satisfaction today. Our contact information was provided should additional questions or concerns arise. Thank you for the referral and allowing [place] to share in the care of your patient.   Clint Guy, [place], [facility] Licensed, Certified Dispensing [email or web address] Phone: [phone]  The patient was seen for a total of 40 minutes in face-to-face genetic counseling.  This patient was discussed with Drs. Magrinat, Lindi Adie and/or Burr Medico who agrees with the above.    _______________________________________________________________________ For Office Staff:  Number of people involved in session: 1 Was an Intern/ student involved with case: no

## 2020-08-01 DIAGNOSIS — Z1379 Encounter for other screening for genetic and chromosomal anomalies: Secondary | ICD-10-CM | POA: Insufficient documentation

## 2020-08-02 ENCOUNTER — Telehealth: Payer: Self-pay | Admitting: Genetic Counselor

## 2020-08-02 ENCOUNTER — Encounter: Payer: Self-pay | Admitting: Genetic Counselor

## 2020-08-02 DIAGNOSIS — Z1501 Genetic susceptibility to malignant neoplasm of breast: Secondary | ICD-10-CM | POA: Insufficient documentation

## 2020-08-02 DIAGNOSIS — Z1509 Genetic susceptibility to other malignant neoplasm: Secondary | ICD-10-CM | POA: Insufficient documentation

## 2020-08-02 NOTE — Telephone Encounter (Signed)
LVM that her genetic test results are available and requested that she call back to discuss them.  

## 2020-08-02 NOTE — Telephone Encounter (Signed)
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 We scheduled a follow-up genetic counseling appointment for Monday (5/16) at 3:30 pm to review this result in greater detail.  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 This VUS should not impact her medical management at this time.

## 2020-08-06 ENCOUNTER — Inpatient Hospital Stay: Payer: BC Managed Care – PPO | Admitting: Genetic Counselor

## 2020-08-06 ENCOUNTER — Telehealth: Payer: Self-pay | Admitting: Genetic Counselor

## 2020-08-06 DIAGNOSIS — U071 COVID-19: Secondary | ICD-10-CM | POA: Diagnosis not present

## 2020-08-06 NOTE — Telephone Encounter (Signed)
Ms. Vanderpol called to cancel her genetic counseling appointment, as she is not feeling well and is undergoing testing for COVID. Offered to switch the appointment to virtual if she is feeling up to it, but Ms. Truxillo declined and said she would rather wait to come in person. She will call us back to reschedule when she is feeling better.

## 2020-08-15 ENCOUNTER — Telehealth: Payer: Self-pay | Admitting: Genetic Counselor

## 2020-08-15 NOTE — Telephone Encounter (Signed)
Rescheduled Linda Jacobson's genetic counseling appointment to Thursday, 08/23/20 at 4:00 pm.

## 2020-08-22 ENCOUNTER — Telehealth: Payer: Self-pay | Admitting: Genetic Counselor

## 2020-08-22 NOTE — Telephone Encounter (Signed)
Linda Jacobson called to ask about [facility]'s COVID testing policy, as she tested positive for COVID on 5/16 although she is currently asymptomatic. Recommended that she call the main cancer center phone number to get the most up to date information regarding COVID testing and in person appointments. She will call me back if she needs to reschedule.

## 2020-08-23 ENCOUNTER — Other Ambulatory Visit: Payer: Self-pay

## 2020-08-23 ENCOUNTER — Encounter: Payer: Self-pay | Admitting: Genetic Counselor

## 2020-08-23 ENCOUNTER — Inpatient Hospital Stay: Payer: BC Managed Care – PPO | Attending: Genetic Counselor | Admitting: Genetic Counselor

## 2020-08-23 DIAGNOSIS — Z808 Family history of malignant neoplasm of other organs or systems: Secondary | ICD-10-CM | POA: Insufficient documentation

## 2020-08-23 DIAGNOSIS — Z1379 Encounter for other screening for genetic and chromosomal anomalies: Secondary | ICD-10-CM | POA: Diagnosis not present

## 2020-08-23 DIAGNOSIS — Z803 Family history of malignant neoplasm of breast: Secondary | ICD-10-CM | POA: Insufficient documentation

## 2020-08-23 DIAGNOSIS — Z1509 Genetic susceptibility to other malignant neoplasm: Secondary | ICD-10-CM

## 2020-08-23 DIAGNOSIS — Z1501 Genetic susceptibility to malignant neoplasm of breast: Secondary | ICD-10-CM | POA: Insufficient documentation

## 2020-08-23 DIAGNOSIS — Z90710 Acquired absence of both cervix and uterus: Secondary | ICD-10-CM | POA: Insufficient documentation

## 2020-08-23 DIAGNOSIS — Z1502 Genetic susceptibility to malignant neoplasm of ovary: Secondary | ICD-10-CM

## 2020-08-24 NOTE — Progress Notes (Signed)
GENETIC TEST RESULTS   Patient Name: Linda Jacobson Patient Age: 60 y.o. Encounter Date: 08/23/2020  Referring Provider: Gaynelle Arabian, MD [address] [place],  [place] [address]    Linda Jacobson was seen in the [place] clinic on 07/19/2020 due to a family history of cancer and a known BRCA mutation, and concern regarding a hereditary predisposition to cancer in the family. Please refer to the prior Genetics clinic note for more information regarding Linda Jacobson's medical and family histories and our assessment at the time.   FAMILY HISTORY:  We obtained a detailed, 4-generation family history.  Significant diagnoses are listed below: Family History  Problem Relation Age of Onset  . Breast cancer Sister 14  . Breast cancer Sister 56  . Liver cancer Maternal Uncle        dx >50  . Breast cancer Maternal Grandmother        unknown age of diagnosis  . BRCA 1/2 Sister        BRCA positive  . Breast cancer Paternal Grandmother        unknown age of diagnosis   Linda Jacobson has one daughter (age 23). She has one brother and six sister. Two sisters had breast cancer - one diagnosed at age 54, the other diagnosed in her 64s. Another sister had genetic testing that revealed a BRCA mutation (report not available for review).  Linda Jacobson's mother died in her late 68s without cancer. There was one maternal aunt and one maternal uncle. Her uncle was diagnosed with liver cancer older than 36. There is no known cancer among maternal cousins. Ms. Lienhard's maternal grandmother died in her early 76s and had breast cancer (unknown age of diagnosis). Her maternal grandfather died in his late 88s without cancer.  Ms. Roselli's father died in his late 37s without cancer. There were two paternal aunts and multiple paternal uncles. There is no known cancer among paternal aunts/uncles or paternal cousins, although she has limited information about these family members.  Ms. Lofgren's paternal grandmother had breast cancer at an unknown age. She does not have information about her paternal grandfather.  Ms. Pinette is aware of previous family history of genetic testing for hereditary cancer risks. Patient's ancestors are of unknown descent. Ms. Mcmeekin is unsure if there is Ashkenazi Jewish ancestry, although she believes it is possible because her maternal grandmother's maiden name was [place]. There is no known consanguinity.  GENETIC TESTING: Genetic testing reported on 08/01/2020 through the [place] + RNAinisght Panel offered by [facility]. A single, heterozygous likely pathogenic variant was detected in the BRCA2 gene called p.D2723E (c.8169T>A).   The CustomNext-Cancer+RNAinsight panel offered by [facility] includes sequencing and rearrangement analysis for the following 91 genes: AIP, ALK, APC, ATM, AXIN2, BAP1, BARD1, BLM, BMPR1A, BRCA1, BRCA2, BRIP1, CDC73, CDH1, CDK4, CDKN1B, CDKN2A, CHEK2, CTNNA1, DICER1, FANCC, FH, FLCN, GALNT12, KIF1B, LZTR1, MAX, MEN1, MET, MLH1, MRE11A, MSH2, MSH3, MSH6, MUTYH, NBN, NF1, NF2, NTHL1, PALB2, PHOX2B, PMS2, POT1, PRKAR1A, PTCH1, PTEN, RAD50, RAD51C, RAD51D, RB1, RECQL, RET, SDHA, SDHAF2, SDHB, SDHC, SDHD, SMAD4, SMARCA4, SMARCB1, SMARCE1, STK11, SUFU, TMEM127, Tp53, TSC1, TSC2, VHL and XRCC2 (sequencing and deletion/duplication); CASR, CFTR, CPA1, CTRC, EGFR, EGLN1, FAM175A, HOXB13, KIT, MITF, MLH3, PALLD, PDGFRA, POLD1, POLE, PRSS1, RINT1, RPS20, SPINK1 and TERT (sequencing only); EPCAM and GREM1 (deletion/duplication only). RNA data is routinely analyzed for use in variant interpretation for all genes. The test report will be scanned into EPIC and located under the Molecular  Pathology section of the Results Review tab.  A portion of the result report is included below for reference.     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 At this time, it is unknown if this variant is associated with increased cancer risk or if this is a normal finding, but most variants such as this get reclassified to being inconsequential. It should not be used to make medical management decisions. With time, we suspect the lab will determine the significance of this variant, if any. If we do learn more about it, we will try to contact Linda Jacobson to discuss it further. However, it is important to stay in touch with [place] periodically and keep the address and phone number up to date.  CANCER RISKS: Both women and men with a BRCA2 mutation are at an increased risk for cancer. Studies show that women with a BRCA2 mutation can have a 61-77% lifetime risk to develop breast cancer, up to a 26% risk to develop a second breast cancer within 20 years, and up to a 11-25% risk to develop ovarian cancer. Men can have a 7-8% lifetime risk to develop female breast cancer and a 20-30% risk for prostate cancer. Both men and women can also have a 3-5% increased risk for pancreatic cancer and a 3-5% increased risk for melanoma.  CANCER RISK REDUCTION & SCREENING RECOMMENDATIONS: The [place] (NCCN) recommends the following for women who carry BRCA mutations: . Breast awareness starting at the age of 26 years; women should report any changes to their breasts to their health care provider. Periodic breast self-exams may facilitate breast self-awareness. . Clinical breast exams every 6-12 months, starting at age 1 years . Annual breast MRI and annual mammograms starting at age 35 years and 30 years, respectively, or individualized based on age of earliest onset of breast cancer in the family. . Consideration of risk reducing mastectomy, which reduces the risk of breast cancer by greater than 90%. . Consideration of risk reducing salpingo-oophorectomy (RRSO), ideally between the ages of 24-45, or individualized based on completion of  child-bearing years or age of earliest onset of ovarian cancer in the family. RRSO reduces the risk of ovarian cancer by greater than 90% and can reduce the risk of breast cancer by 50% if performed prior to menopause. Women who have undergone risk reducing RRSO have a small risk of peritoneal carcinoma and can consider annual CA-125 surveillance. . For women who still have their ovaries, transvaginal ultrasound and CA-125 testing every 6 months starting at age 79 years, or 5-10 years prior to the earliest age of onset of ovarian cancer in the family can be considered. Studies have not demonstrated that ovarian cancer screening is effective in detecting early ovarian cancer. . Consideration of chemoprevention options such as oral contraceptives, Tamoxifen and Raloxifene, if appropriate for an individual.  Linda Jacobson is strongly considering bilateral mastectomies, which is the most effective option available to reduce future breast cancer risk. We will plan to refer Linda Jacobson to the high-risk breast clinic to discuss her risk reduction options and establish a plan.  To reduce the risk for ovarian cancer, we recommend Linda Jacobson have a prophylactic bilateral salpingo-oophorectomy. We discussed that screening with CA-125 blood tests and transvaginal ultrasounds can be done twice per year. However, these tests have not been shown to detect ovarian cancer at an early stage. Linda Jacobson typically receives her gynecologic care from  her PCP, Dr. Marisue Humble. She will plan to also discuss the ovarian risk and surgery with the high risk providers.  The following is recommended for both men and women who carry a BRCA2 mutation: . Consideration of pancreatic cancer screening if there is a family history of pancreatic cancer . General melanoma risk management, such as annual full-body skin examination and minimizing UV exposure . Consider investigational imaging and screening studies, when available (e.g.  novel imaging technologies, more frequent screening intervals) in the context of a clinical trial  The [employer]  (NCCN) recommends the following for men who carry a BRCA2 mutation: . Breast self-exam training and education starting at age 46 years . Clinical breast exam, every 12 months, starting at age 71 years . Consider annual mammogram screening in men with gynecomastia starting at age 57, or 16 years before the earliest known female breast cancer in the family (whichever comes first) . Prostate cancer screening starting at age 22 years (PSA and digital rectal exam)  These guidelines are based on current NCCN guidelines (Genetic/Familial High-Risk Assessment: Breast, Ovarian, and Pancreatic Version 2.2022). These guidelines are continually updated and subject to change. They should be directly referenced for future medical management.  FAMILY MEMBERS: It is important that all of Linda Jacobson relatives (both men and women) know of the presence of this gene mutation. Site-specific genetic testing can sort out who in the family is at risk and who is not.  Linda Jacobson's children and siblings have a 50% chance to have inherited this mutation. We recommend they have genetic testing for this same mutation, as identifying the presence of this mutation would allow them to also take advantage of risk-reducing measures.   Additionally, individuals with a pathogenic variant in BRCA2 are carriers of a rare genetic condition called Fanconi anemia. Fanconi anemia is an autosomal recessive disorder that is characterized by bone marrow failure and variable presentation of anomalies, including short stature, abnormal skin pigmentation, abnormal thumbs, malformations of the skeletal and central nervous systems, and developmental delay. Risks for leukemia and early onset solid tumors are significantly elevated. For there to be a risk of Fanconi anemia in offspring, both parents would  each have to have a single pathogenic variant in BRCA2; in such a case, the risk of having an affected child is 25%.  SUPPORT AND RESOURCES: If Linda Jacobson is interested in BRCA-specific information and support, there are two groups, Facing Our Risk (www.facingourrisk.org) and Bright Pink (www.brightpink.org) which some people have found useful. They provide opportunities to speak with other individuals from high-risk families. To locate genetic counselors in other cities, visit the website www.FindAGeneticCounselor.com and search for a counselor by zip code.  We encouraged Linda Jacobson to remain in contact with [place] on an annual basis so we can update her personal and family histories, and let her know of advances in cancer genetics that may benefit the family. Our contact number was provided. Linda Jacobson's questions were answered to her satisfaction today, and she knows she is welcome to call anytime with additional questions.   Clint Guy, [place], [facility] Licensed, Certified Dispensing [email or web address] phone: [phone]  The patient was seen for a total of 35 minutes in face-to-face genetic counseling. She was accompanied by her husband, Linda Jacobson.

## 2020-08-28 ENCOUNTER — Telehealth: Payer: Self-pay | Admitting: Hematology and Oncology

## 2020-08-28 DIAGNOSIS — R519 Headache, unspecified: Secondary | ICD-10-CM | POA: Diagnosis not present

## 2020-08-28 DIAGNOSIS — M5032 Other cervical disc degeneration, mid-cervical region, unspecified level: Secondary | ICD-10-CM | POA: Diagnosis not present

## 2020-08-28 DIAGNOSIS — M9902 Segmental and somatic dysfunction of thoracic region: Secondary | ICD-10-CM | POA: Diagnosis not present

## 2020-08-28 DIAGNOSIS — M9901 Segmental and somatic dysfunction of cervical region: Secondary | ICD-10-CM | POA: Diagnosis not present

## 2020-08-28 NOTE — Telephone Encounter (Signed)
Scheduled appt per 6/6 referral. Called pt, no answer. Left msg with appt date and time.

## 2020-08-29 ENCOUNTER — Telehealth: Payer: Self-pay | Admitting: Hematology and Oncology

## 2020-08-29 NOTE — Telephone Encounter (Signed)
Pt called in to r/s appt. R/s appt to next week per pt request.

## 2020-08-30 ENCOUNTER — Encounter: Payer: BC Managed Care – PPO | Admitting: Hematology and Oncology

## 2020-09-06 IMAGING — MG MM DIGITAL DIAGNOSTIC UNILAT*L* W/ TOMO W/ CAD
4 series · 4 of 12 positions shown · non-contrast
Comparison: Previous exam(s).

CLINICAL DATA: Possible mass in the lateral retroareolar left
breast on a recent screening mammogram.

EXAM:
DIGITAL DIAGNOSTIC LEFT MAMMOGRAM WITH TOMO
ULTRASOUND LEFT BREAST

[L CC synth-2D]
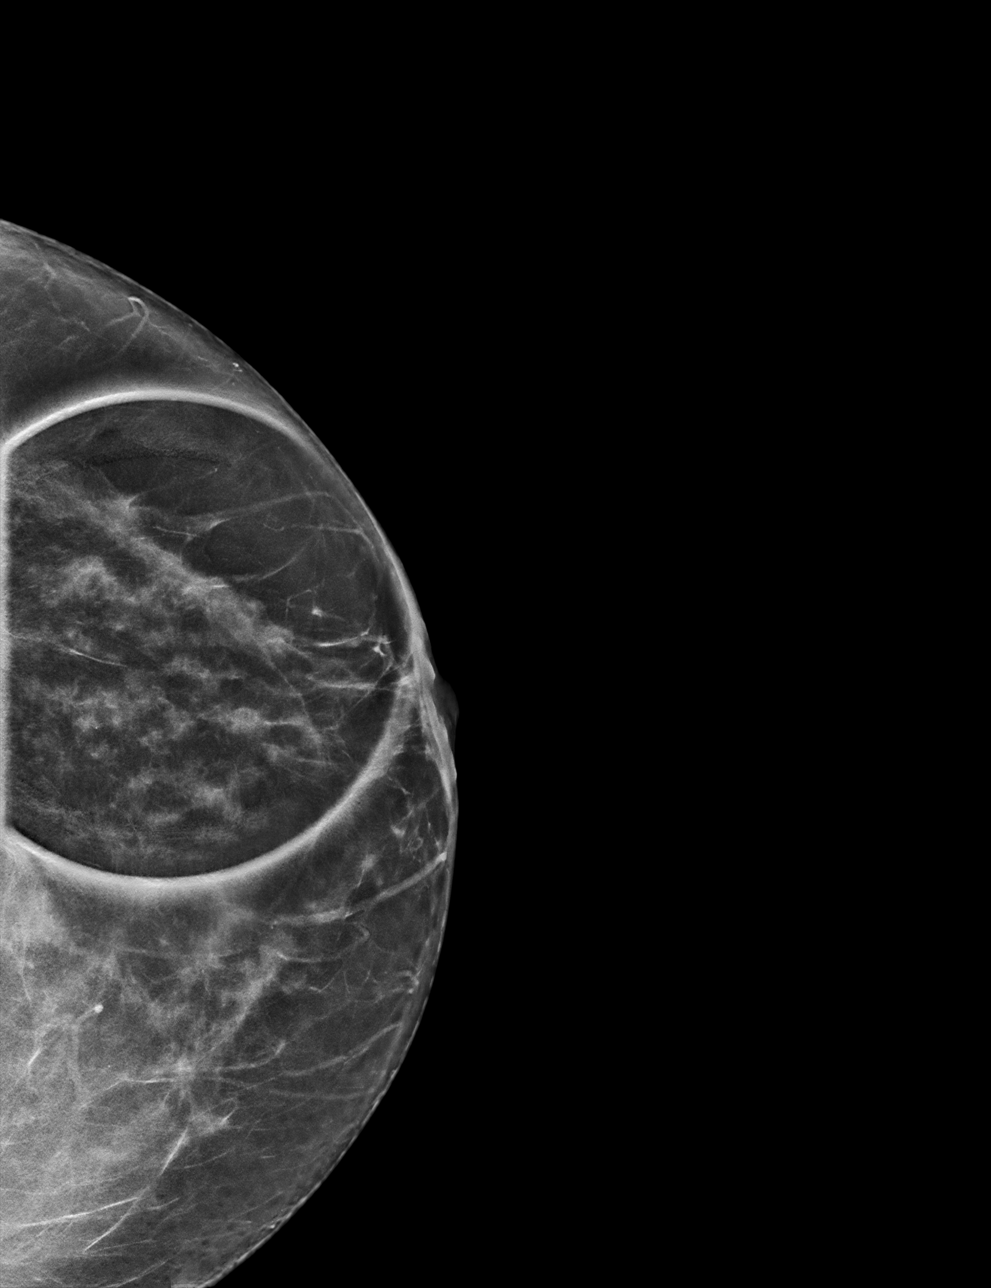

[L MLO synth-2D]
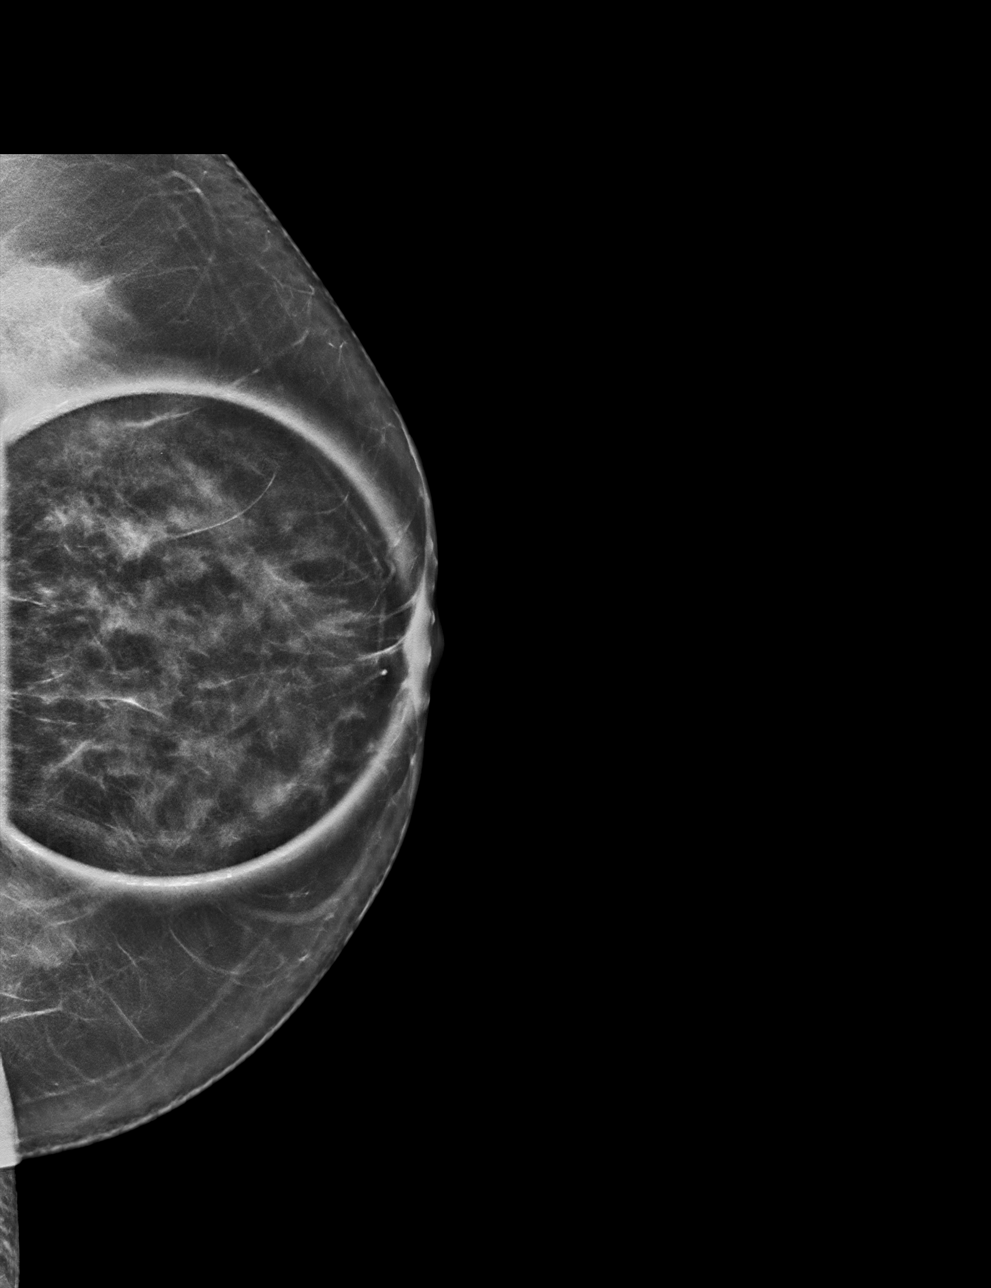

[L MLO tomo · tomo slice 36/71.0]
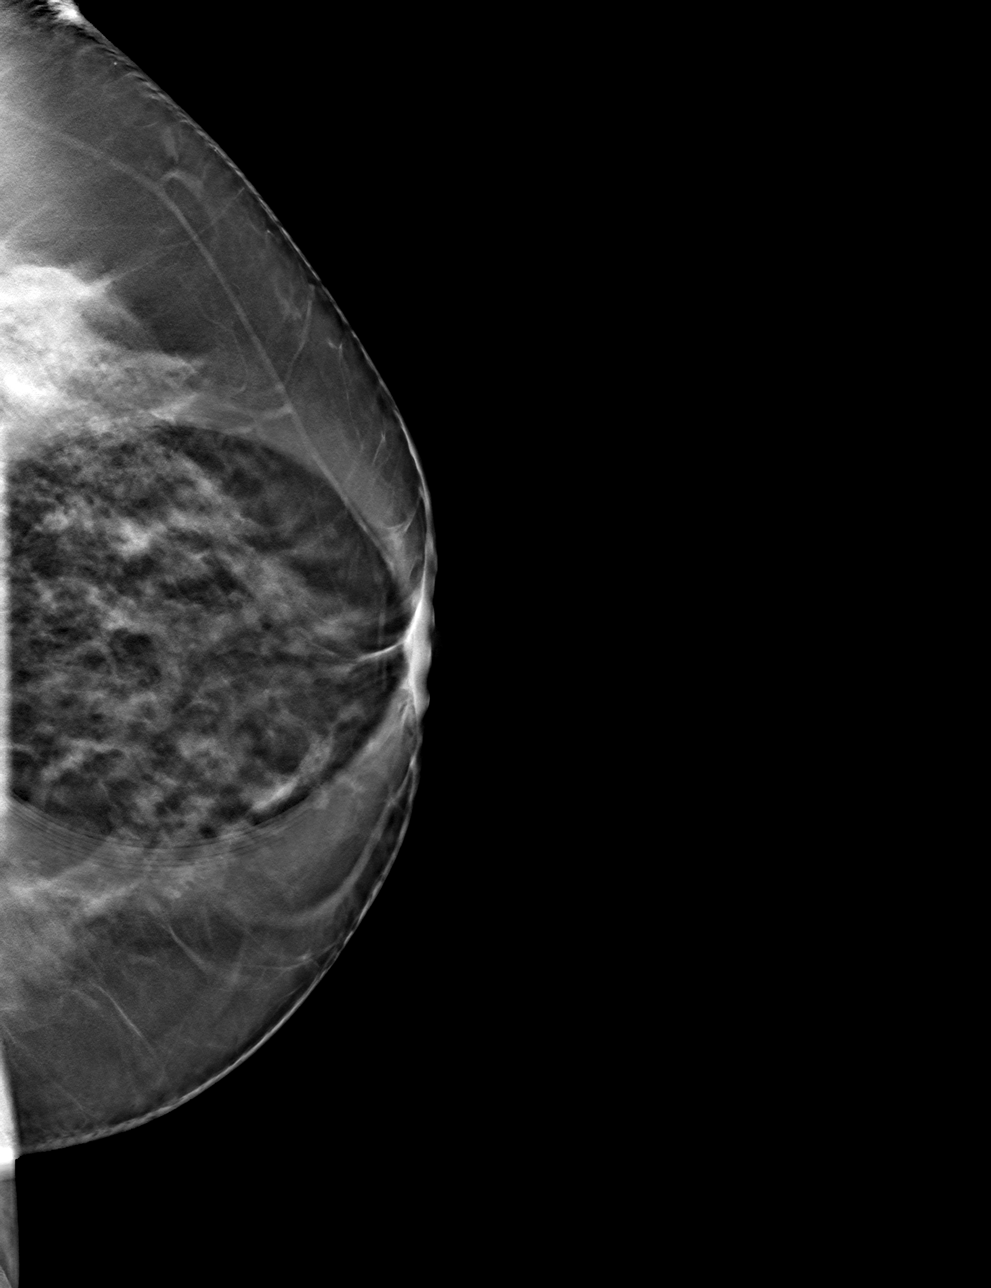

[L CC tomo · tomo slice 33/64.0]
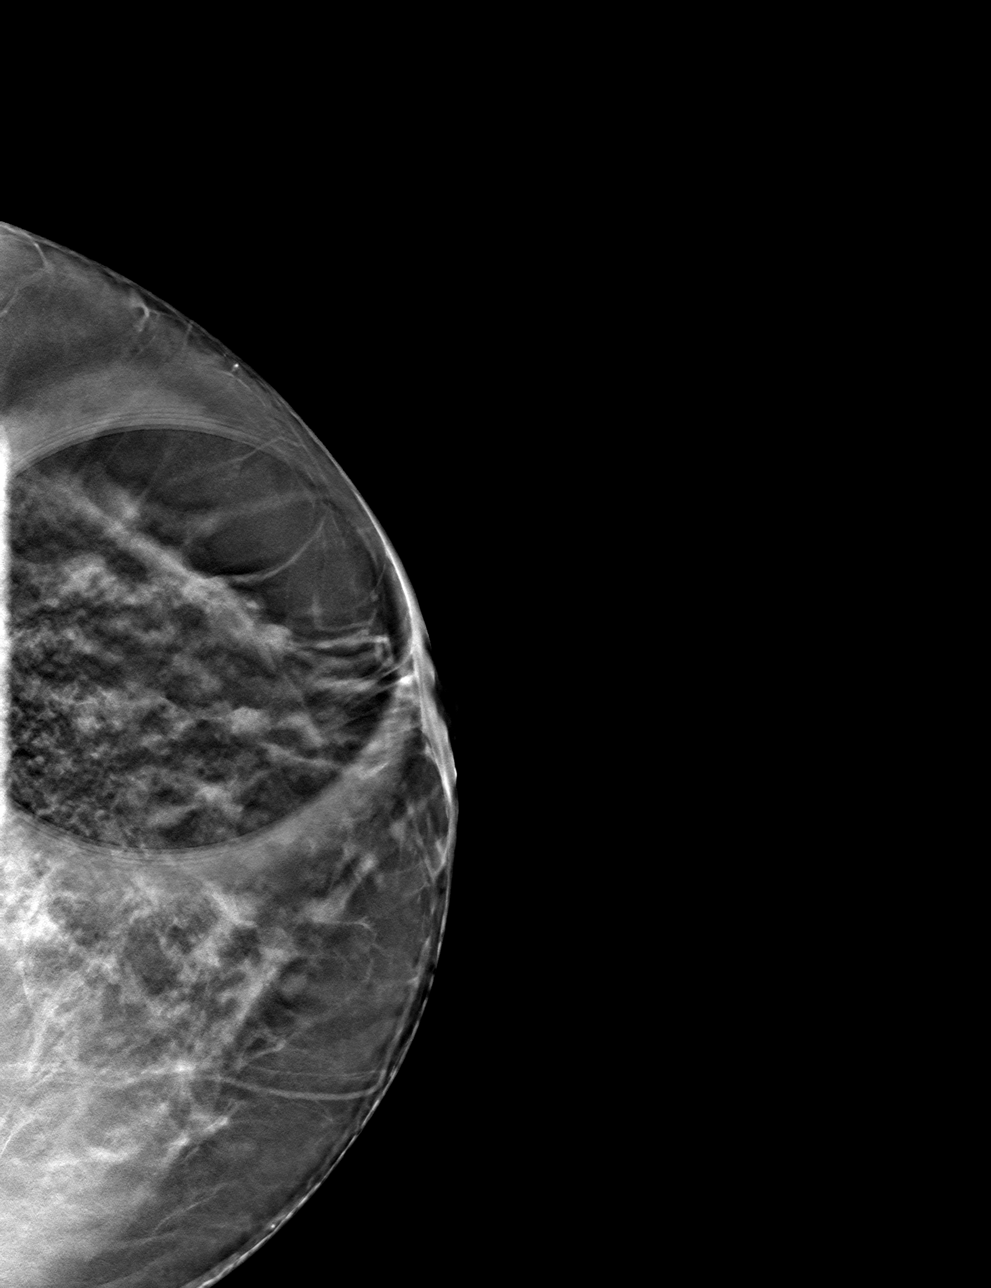

[4 of 12 positions shown; findings below may reference images not displayed]

ACR Breast Density Category c: The breast tissue is heterogeneously
dense, which may obscure small masses.
FINDINGS: 3D tomographic and 2D generated spot compression views of the left
breast demonstrate normal appearing fibroglandular tissue at the
location of the recently suspected mass.

Targeted ultrasound is performed, showing an 8 mm cyst with
low-level internal echoes in the 2 o'clock position of the left
breast, 2 cm from the nipple. No internal blood flow was seen with
power Doppler.
IMPRESSION: Benign left breast cyst.  No evidence of malignancy.

RECOMMENDATION:
Bilateral screening mammogram in 1 year.

I have discussed the findings and recommendations with the patient.
If applicable, a reminder letter will be sent to the patient
regarding the next appointment.

BI-RADS CATEGORY  2: Benign.

## 2020-09-06 IMAGING — US US BREAST*L* LIMITED INC AXILLA
1 series · 5 of 5 positions shown · non-contrast
Comparison: Previous exam(s).

CLINICAL DATA: Possible mass in the lateral retroareolar left
breast on a recent screening mammogram.

EXAM:
DIGITAL DIAGNOSTIC LEFT MAMMOGRAM WITH TOMO
ULTRASOUND LEFT BREAST

[Series 1: us breast*left* limited inc axilla · 0.07mm/px · 5 of 5 slices shown]
[im 1/5]
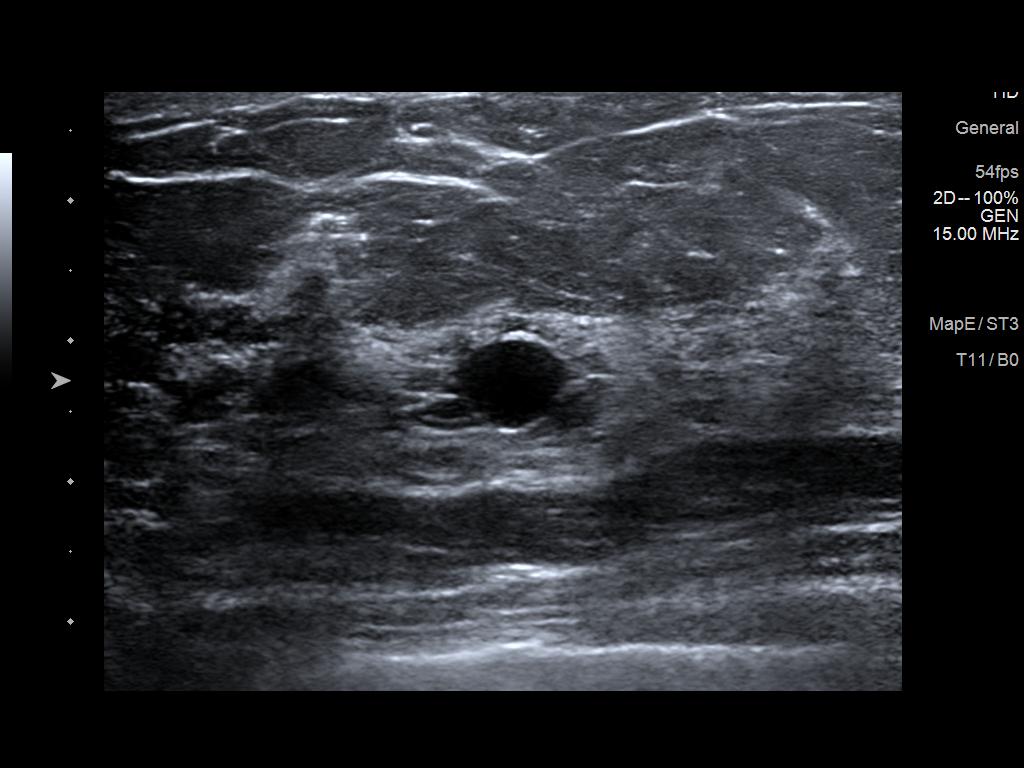
[im 2/5]
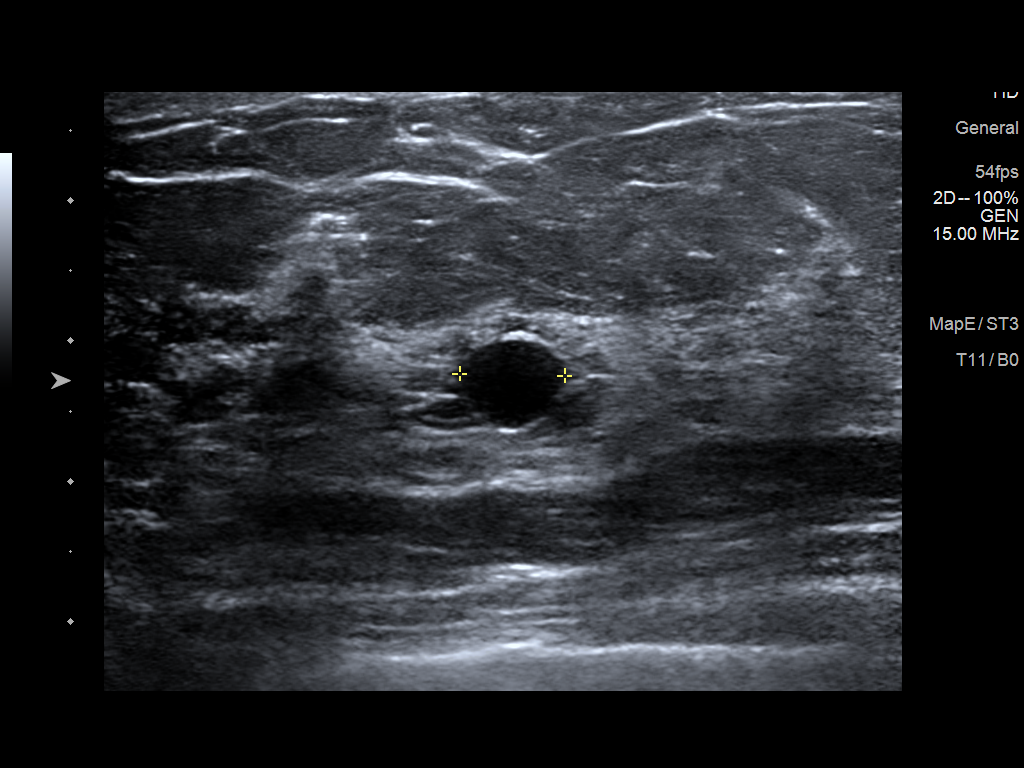
[im 3/5]
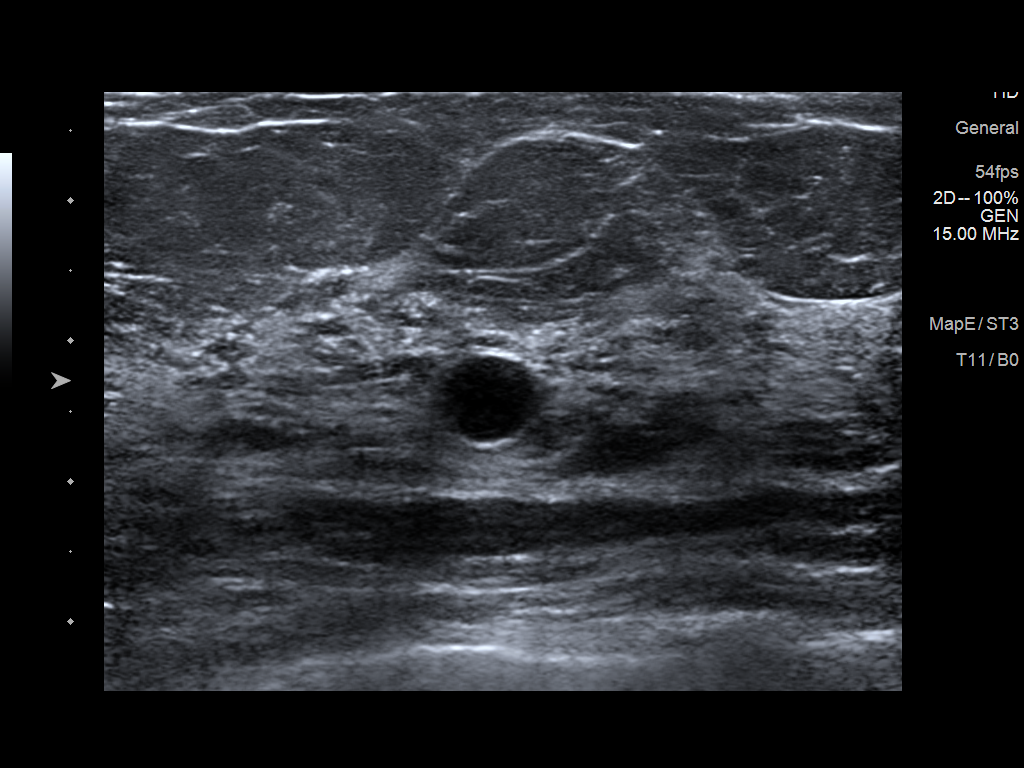
[im 4/5]
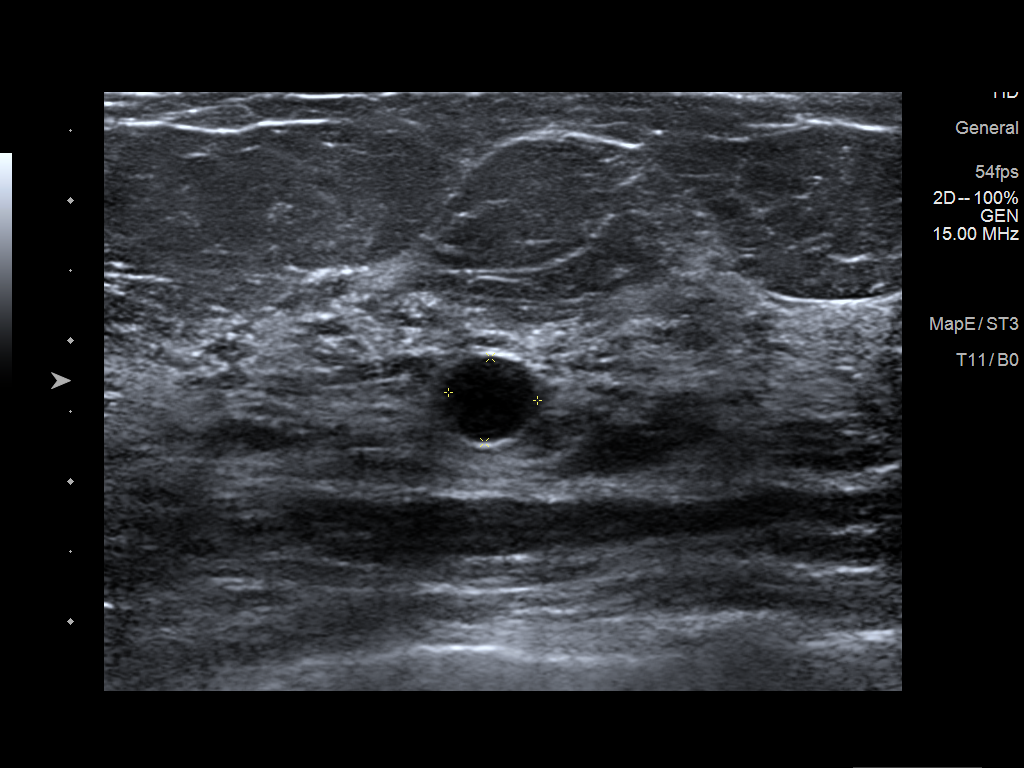
[im 5/5]
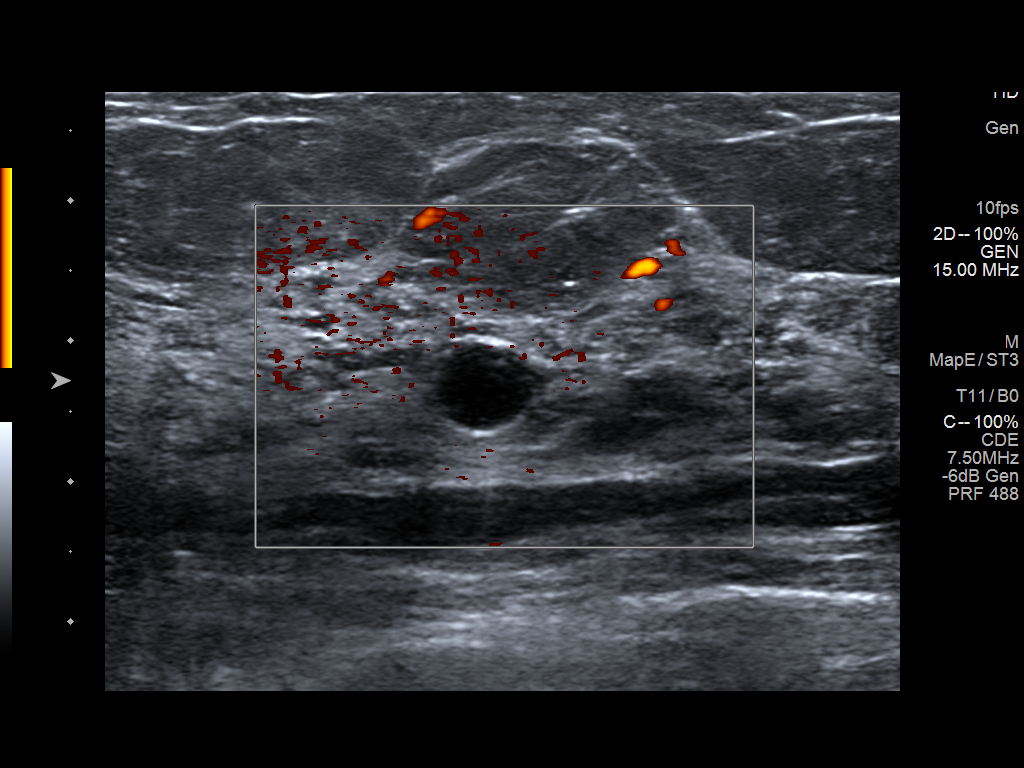

[5 of 5 positions shown; findings below may reference images not displayed]

ACR Breast Density Category c: The breast tissue is heterogeneously
dense, which may obscure small masses.
FINDINGS: 3D tomographic and 2D generated spot compression views of the left
breast demonstrate normal appearing fibroglandular tissue at the
location of the recently suspected mass.

Targeted ultrasound is performed, showing an 8 mm cyst with
low-level internal echoes in the 2 o'clock position of the left
breast, 2 cm from the nipple. No internal blood flow was seen with
power Doppler.
IMPRESSION: Benign left breast cyst.  No evidence of malignancy.

RECOMMENDATION:
Bilateral screening mammogram in 1 year.

I have discussed the findings and recommendations with the patient.
If applicable, a reminder letter will be sent to the patient
regarding the next appointment.

BI-RADS CATEGORY  2: Benign.

## 2020-09-07 ENCOUNTER — Inpatient Hospital Stay (HOSPITAL_BASED_OUTPATIENT_CLINIC_OR_DEPARTMENT_OTHER): Payer: BC Managed Care – PPO | Admitting: Hematology and Oncology

## 2020-09-07 ENCOUNTER — Other Ambulatory Visit: Payer: Self-pay

## 2020-09-07 ENCOUNTER — Encounter: Payer: Self-pay | Admitting: Hematology and Oncology

## 2020-09-07 VITALS — BP 125/81 | HR 95 | Temp 98.5°F | Resp 18 | Ht 64.0 in | Wt 152.0 lb

## 2020-09-07 DIAGNOSIS — Z1501 Genetic susceptibility to malignant neoplasm of breast: Secondary | ICD-10-CM

## 2020-09-07 DIAGNOSIS — Z1502 Genetic susceptibility to malignant neoplasm of ovary: Secondary | ICD-10-CM | POA: Diagnosis not present

## 2020-09-07 DIAGNOSIS — Z808 Family history of malignant neoplasm of other organs or systems: Secondary | ICD-10-CM | POA: Diagnosis not present

## 2020-09-07 DIAGNOSIS — Z1509 Genetic susceptibility to other malignant neoplasm: Secondary | ICD-10-CM | POA: Diagnosis not present

## 2020-09-07 DIAGNOSIS — Z803 Family history of malignant neoplasm of breast: Secondary | ICD-10-CM

## 2020-09-07 DIAGNOSIS — Z90710 Acquired absence of both cervix and uterus: Secondary | ICD-10-CM

## 2020-09-07 NOTE — Progress Notes (Signed)
[place] CONSULT NOTE  Patient Care Team: Gaynelle Arabian, MD as PCP - General (Family Medicine)  CHIEF COMPLAINTS/PURPOSE OF CONSULTATION:  BRCA 2 gene mutation  ASSESSMENT & PLAN:   This is a very pleasant 60 yr old female patient with new diagnosis of breast cancer referred to high risk breast cancer clinic given new finding of pathogenic BRCA 2 mutation.  BRCA2 gene mutation: I discussed with the patient that BRCA2 is a tumor suppressor gene which helps repair damaged DNA or destroy cells if they cannot be repaired. In patients with BRCA1 or 2 mutations, the damaged DNA could not be repaired properly increasing the risk of cancers. BRCA2 gene is located on long arm of chromosome 13. These mutations are inherited in autosomal dominant fashion and hence 50% probability that their children may have a BRCA mutation.  Cancer risk:  Average to risk of cancer by age of 30: (Antoniou et al pooled pedigree data from 40 studies of 8139 index patients with breast or ovarian cancer) Breast cancer risk: 45 percent (95% CI, 33 to 54 percent) Ovarian cancer risk: 11 percent (95% CI, 4.1 to 18 percent)  [place] study: Tumor limited to risk by age of 16: Breast cancer risk: 71 percent (95% CI, 41 to 70 percent) Ovarian cancer risk: 16.5 percent (95% CI, 7.5 to 34 percent)  I discussed the difference between BRCA1 and BRCA2 wherein patients BRCA1 patients have early onset disease and much higher risk of breast cancer. The mean age of diagnosis of breast cancer between BRCA1 and 2 are 71 versus 29 years, risk of ovarian cancer is also higher with BRCA1 but the overall risk under the age of 13 is very low.  Other cancer risks:  1. Pancreatic cancer (relative risk is 3.51) with incidence of 4.9% in BRCA2 carriers 2. Fallopian tube carcinoma and primary peritoneal carcinoma 3. Uterine papillary serous carcinoma: Overall risk is very low 4. Colorectal cancers: In many studies there was no  increased risk But there may be some for BRCA1 carriers 5. Melanoma and other skin cancers: The risk of oatmeal melanoma is increase in BRCA2 mutation carriers but it still extremely rare. 6. Endometrial cancer: Not very clear in terms of risks it is thought to be very low  Breast cancer risk reduction/surveillance: 1. Annual mammogram and breast MRI are recommended by NCCN starting at age of 54-30 2. Chemoprevention with tamoxifen-like agents is not entirely clear. Based on NSABP P1 study in the small cohort of patients with BRCA1 mutations, tamoxifen to reduce breast cancer risk by 62% and BRCA2 carriers but not in BRCA1 carriers. The study is limited because of small numbers. I do not recommend it primarily because most commonly patients with BRCA mutations tend to have triple negative disease. 3. After childbearing, evaluation for prophylactic bilateral mastectomy is an option. 4. Oophorectomy self reduces the risk of breast cancer by 50% 5. Breast self-examinations starting at age 30   Ovarian cancer risk reduction: 1. Risk reducing bilateral salpingo-oophorectomy between the ages of 42-40 once childbearing is complete is recommended. In one study that was 72% reduction of risk of ovarian cancer and a decrease in breast cancer by approximately 50% 2. There is no clear data for surveillance with CA125 or vaginal ultrasounds 2. Oral contraception dose may be protective against ovarian cancer but it may slightly increase risk of breast cancer.  She would like to proceed with BSO and bilateral mastectomy. She was informed of role of tamoxifen which is not  entirely clear. Referrals will be send to breast surgery and plastic surgery.  HISTORY OF PRESENTING ILLNESS:   Linda Jacobson 60 y.o. female is here because of BRCA 2 mutation. This is a very pleasant 60 year old female patient with no significant past medical history diagnosed with pathogenic BRCA2 mutation referred to high-risk breast  cancer clinic for additional recommendations.  Patient is very healthy at baseline has no major complaints.  She had hysterectomy and C-section many years ago.  Hysterectomy was mostly for fibroids.  She got tested because of her sisters who both had breast cancer.  Maternal grandmother also had breast cancer.  No other concerning review of systems.  REVIEW OF SYSTEMS:   Constitutional: Denies fevers, chills or abnormal night sweats Eyes: Denies blurriness of vision, double vision or watery eyes Ears, nose, mouth, throat, and face: Denies mucositis or sore throat Respiratory: Denies cough, dyspnea or wheezes Cardiovascular: Denies palpitation, chest discomfort or lower extremity swelling Gastrointestinal:  Denies nausea, heartburn or change in bowel habits Skin: Denies abnormal skin rashes Lymphatics: Denies new lymphadenopathy or easy bruising Neurological:Denies numbness, tingling or new weaknesses Behavioral/Psych: Mood is stable, no new changes  All other systems were reviewed with the patient and are negative.  MEDICAL HISTORY:  Past Medical History:  Diagnosis Date   Family history of BRCA gene mutation 07/20/2020   Family history of breast cancer    Family history of liver cancer     SURGICAL HISTORY: No past surgical history on file.  SOCIAL HISTORY: Social History   Socioeconomic History   Marital status: Married    Spouse name: Not on file   Number of children: Not on file   Years of education: Not on file   Highest education level: Not on file  Occupational History   Not on file  Tobacco Use   Smoking status: Never   Smokeless tobacco: Never  Vaping Use   Vaping Use: Never used  Substance and Sexual Activity   Alcohol use: Yes    Comment: occ   Drug use: Not on file   Sexual activity: Not on file  Other Topics Concern   Not on file  Social History Narrative   Not on file   Social Determinants of Health   Financial Resource Strain: Not on file  Food  Insecurity: Not on file  Transportation Needs: Not on file  Physical Activity: Not on file  Stress: Not on file  Social Connections: Not on file  Intimate Partner Violence: Not on file    FAMILY HISTORY: Family History  Problem Relation Age of Onset   Breast cancer Sister 88   Breast cancer Sister 11   Liver cancer Maternal Uncle        dx >50   Breast cancer Maternal Grandmother        unknown age of diagnosis   BRCA 1/2 Sister        BRCA positive   Breast cancer Paternal Grandmother        unknown age of diagnosis    ALLERGIES:  has No Known Allergies.  MEDICATIONS:  Current Outpatient Medications  Medication Sig Dispense Refill   estradiol (ESTRACE) 0.1 MG/GM vaginal cream Place vaginally.     nortriptyline (PAMELOR) 10 MG capsule Take 10 mg by mouth 2 (two) times daily.     predniSONE (DELTASONE) 20 MG tablet Take one tab by mouth twice daily for 4 days, then one daily. Take with food. 12 tablet 0   SUMAtriptan (IMITREX) 100  MG tablet Take 100 mg by mouth as directed.     No current facility-administered medications for this visit.   PHYSICAL EXAMINATION:  ECOG PERFORMANCE STATUS: 0 - Asymptomatic  Vitals:   09/07/20 1356  BP: 125/81  Pulse: 95  Resp: 18  Temp: 98.5 F (36.9 C)  SpO2: 99%   Filed Weights   09/07/20 1356  Weight: 152 lb (68.9 kg)    GENERAL:alert, no distress and comfortable Breast : Bilateral breasts appear normal to inspection and palpation.  No concerning masses, nipple or skin changes.  No regional adenopathy. Extremities: NO LE edema.  LABORATORY DATA:  I have reviewed the data as listed No results found for: WBC, HGB, HCT, MCV, PLT   Chemistry   No results found for: NA, K, CL, CO2, BUN, CREATININE, GLU No results found for: CALCIUM, ALKPHOS, AST, ALT, BILITOT     RADIOGRAPHIC STUDIES: I have personally reviewed the radiological images as listed and agreed with the findings in the report. No results found.  All questions  were answered. The patient knows to call the clinic with any problems, questions or concerns. I spent 45 minutes in the care of this patient including H and P, review of records, counseling and coordination of care. We have discussed about BRCA2 mutation, role of BRCA2 pathogenic mutation in breast cancer, ovarian cancer and other cancers, recommendations.    Benay Pike, MD 09/07/2020 2:02 PM

## 2020-09-11 DIAGNOSIS — M5032 Other cervical disc degeneration, mid-cervical region, unspecified level: Secondary | ICD-10-CM | POA: Diagnosis not present

## 2020-09-11 DIAGNOSIS — R519 Headache, unspecified: Secondary | ICD-10-CM | POA: Diagnosis not present

## 2020-09-11 DIAGNOSIS — M9901 Segmental and somatic dysfunction of cervical region: Secondary | ICD-10-CM | POA: Diagnosis not present

## 2020-09-11 DIAGNOSIS — M9902 Segmental and somatic dysfunction of thoracic region: Secondary | ICD-10-CM | POA: Diagnosis not present

## 2020-10-12 ENCOUNTER — Other Ambulatory Visit: Payer: Self-pay | Admitting: Family Medicine

## 2020-10-12 DIAGNOSIS — Z1231 Encounter for screening mammogram for malignant neoplasm of breast: Secondary | ICD-10-CM

## 2020-10-17 DIAGNOSIS — M9901 Segmental and somatic dysfunction of cervical region: Secondary | ICD-10-CM | POA: Diagnosis not present

## 2020-10-17 DIAGNOSIS — M9902 Segmental and somatic dysfunction of thoracic region: Secondary | ICD-10-CM | POA: Diagnosis not present

## 2020-10-17 DIAGNOSIS — R519 Headache, unspecified: Secondary | ICD-10-CM | POA: Diagnosis not present

## 2020-10-17 DIAGNOSIS — M5032 Other cervical disc degeneration, mid-cervical region, unspecified level: Secondary | ICD-10-CM | POA: Diagnosis not present

## 2020-10-20 ENCOUNTER — Ambulatory Visit
Admission: RE | Admit: 2020-10-20 | Discharge: 2020-10-20 | Disposition: A | Discharge status: Home / Self Care | Payer: BC Managed Care – PPO | Source: Ambulatory Visit

## 2020-10-20 DIAGNOSIS — Z1231 Encounter for screening mammogram for malignant neoplasm of breast: Secondary | ICD-10-CM | POA: Diagnosis not present

## 2020-10-31 DIAGNOSIS — R519 Headache, unspecified: Secondary | ICD-10-CM | POA: Diagnosis not present

## 2020-10-31 DIAGNOSIS — M9901 Segmental and somatic dysfunction of cervical region: Secondary | ICD-10-CM | POA: Diagnosis not present

## 2020-10-31 DIAGNOSIS — M9902 Segmental and somatic dysfunction of thoracic region: Secondary | ICD-10-CM | POA: Diagnosis not present

## 2020-10-31 DIAGNOSIS — M5032 Other cervical disc degeneration, mid-cervical region, unspecified level: Secondary | ICD-10-CM | POA: Diagnosis not present

## 2020-11-05 DIAGNOSIS — Z803 Family history of malignant neoplasm of breast: Secondary | ICD-10-CM | POA: Diagnosis not present

## 2020-11-05 DIAGNOSIS — Z1501 Genetic susceptibility to malignant neoplasm of breast: Secondary | ICD-10-CM | POA: Diagnosis not present

## 2020-11-08 ENCOUNTER — Other Ambulatory Visit: Payer: Self-pay | Admitting: General Surgery

## 2020-11-08 DIAGNOSIS — Z1501 Genetic susceptibility to malignant neoplasm of breast: Secondary | ICD-10-CM

## 2020-11-15 DIAGNOSIS — Z1501 Genetic susceptibility to malignant neoplasm of breast: Secondary | ICD-10-CM | POA: Diagnosis not present

## 2020-11-15 DIAGNOSIS — Z803 Family history of malignant neoplasm of breast: Secondary | ICD-10-CM | POA: Diagnosis not present

## 2020-11-15 DIAGNOSIS — Z1502 Genetic susceptibility to malignant neoplasm of ovary: Secondary | ICD-10-CM | POA: Diagnosis not present

## 2020-11-15 DIAGNOSIS — Z1509 Genetic susceptibility to other malignant neoplasm: Secondary | ICD-10-CM | POA: Diagnosis not present

## 2020-11-27 DIAGNOSIS — M9902 Segmental and somatic dysfunction of thoracic region: Secondary | ICD-10-CM | POA: Diagnosis not present

## 2020-11-27 DIAGNOSIS — R519 Headache, unspecified: Secondary | ICD-10-CM | POA: Diagnosis not present

## 2020-11-27 DIAGNOSIS — M5032 Other cervical disc degeneration, mid-cervical region, unspecified level: Secondary | ICD-10-CM | POA: Diagnosis not present

## 2020-11-27 DIAGNOSIS — M9901 Segmental and somatic dysfunction of cervical region: Secondary | ICD-10-CM | POA: Diagnosis not present

## 2020-11-29 ENCOUNTER — Ambulatory Visit
Admission: RE | Admit: 2020-11-29 | Discharge: 2020-11-29 | Disposition: A | Discharge status: Home / Self Care | Payer: BC Managed Care – PPO | Source: Ambulatory Visit | Attending: General Surgery | Admitting: General Surgery

## 2020-11-29 ENCOUNTER — Other Ambulatory Visit: Payer: Self-pay

## 2020-11-29 DIAGNOSIS — Z1501 Genetic susceptibility to malignant neoplasm of breast: Secondary | ICD-10-CM

## 2020-11-29 DIAGNOSIS — R928 Other abnormal and inconclusive findings on diagnostic imaging of breast: Secondary | ICD-10-CM | POA: Diagnosis not present

## 2020-11-29 DIAGNOSIS — Z1509 Genetic susceptibility to other malignant neoplasm: Secondary | ICD-10-CM

## 2020-11-29 MED ORDER — GADOBUTROL 1 MMOL/ML IV SOLN
7.0000 mL | Freq: Once | INTRAVENOUS | Status: AC | PRN
  Administered 2020-11-29: 7 mL via INTRAVENOUS

## 2020-12-10 DIAGNOSIS — Z803 Family history of malignant neoplasm of breast: Secondary | ICD-10-CM | POA: Diagnosis not present

## 2020-12-10 DIAGNOSIS — Z1501 Genetic susceptibility to malignant neoplasm of breast: Secondary | ICD-10-CM | POA: Diagnosis not present

## 2020-12-10 DIAGNOSIS — Z1509 Genetic susceptibility to other malignant neoplasm: Secondary | ICD-10-CM | POA: Diagnosis not present

## 2020-12-10 DIAGNOSIS — Z1502 Genetic susceptibility to malignant neoplasm of ovary: Secondary | ICD-10-CM | POA: Diagnosis not present

## 2020-12-10 DIAGNOSIS — N6481 Ptosis of breast: Secondary | ICD-10-CM | POA: Diagnosis not present

## 2020-12-12 DIAGNOSIS — N6481 Ptosis of breast: Secondary | ICD-10-CM | POA: Insufficient documentation

## 2020-12-25 DIAGNOSIS — R519 Headache, unspecified: Secondary | ICD-10-CM | POA: Diagnosis not present

## 2020-12-25 DIAGNOSIS — M5032 Other cervical disc degeneration, mid-cervical region, unspecified level: Secondary | ICD-10-CM | POA: Diagnosis not present

## 2020-12-25 DIAGNOSIS — M9902 Segmental and somatic dysfunction of thoracic region: Secondary | ICD-10-CM | POA: Diagnosis not present

## 2020-12-25 DIAGNOSIS — M9901 Segmental and somatic dysfunction of cervical region: Secondary | ICD-10-CM | POA: Diagnosis not present

## 2021-01-15 DIAGNOSIS — R519 Headache, unspecified: Secondary | ICD-10-CM | POA: Diagnosis not present

## 2021-01-15 DIAGNOSIS — M9901 Segmental and somatic dysfunction of cervical region: Secondary | ICD-10-CM | POA: Diagnosis not present

## 2021-01-15 DIAGNOSIS — M5032 Other cervical disc degeneration, mid-cervical region, unspecified level: Secondary | ICD-10-CM | POA: Diagnosis not present

## 2021-01-15 DIAGNOSIS — M9902 Segmental and somatic dysfunction of thoracic region: Secondary | ICD-10-CM | POA: Diagnosis not present

## 2021-01-23 DIAGNOSIS — Z1502 Genetic susceptibility to malignant neoplasm of ovary: Secondary | ICD-10-CM | POA: Diagnosis not present

## 2021-01-23 DIAGNOSIS — Z803 Family history of malignant neoplasm of breast: Secondary | ICD-10-CM | POA: Diagnosis not present

## 2021-01-23 DIAGNOSIS — Z1509 Genetic susceptibility to other malignant neoplasm: Secondary | ICD-10-CM | POA: Diagnosis not present

## 2021-01-23 DIAGNOSIS — K7689 Other specified diseases of liver: Secondary | ICD-10-CM | POA: Diagnosis not present

## 2021-01-23 DIAGNOSIS — K769 Liver disease, unspecified: Secondary | ICD-10-CM | POA: Diagnosis not present

## 2021-01-23 DIAGNOSIS — Z01818 Encounter for other preprocedural examination: Secondary | ICD-10-CM | POA: Diagnosis not present

## 2021-01-23 DIAGNOSIS — Z1501 Genetic susceptibility to malignant neoplasm of breast: Secondary | ICD-10-CM | POA: Diagnosis not present

## 2021-01-23 DIAGNOSIS — N6481 Ptosis of breast: Secondary | ICD-10-CM | POA: Diagnosis not present

## 2021-03-07 ENCOUNTER — Encounter: Payer: Self-pay | Admitting: Hematology and Oncology

## 2021-03-07 ENCOUNTER — Other Ambulatory Visit: Payer: Self-pay | Admitting: *Deleted

## 2021-03-07 ENCOUNTER — Other Ambulatory Visit: Payer: Self-pay

## 2021-03-07 ENCOUNTER — Inpatient Hospital Stay: Payer: BC Managed Care – PPO | Attending: Hematology and Oncology | Admitting: Hematology and Oncology

## 2021-03-07 VITALS — BP 121/77 | HR 89 | Temp 97.5°F | Resp 17 | Wt 153.1 lb

## 2021-03-07 DIAGNOSIS — R2231 Localized swelling, mass and lump, right upper limb: Secondary | ICD-10-CM

## 2021-03-07 DIAGNOSIS — Z803 Family history of malignant neoplasm of breast: Secondary | ICD-10-CM | POA: Diagnosis not present

## 2021-03-07 DIAGNOSIS — Z808 Family history of malignant neoplasm of other organs or systems: Secondary | ICD-10-CM | POA: Diagnosis not present

## 2021-03-07 DIAGNOSIS — Z1501 Genetic susceptibility to malignant neoplasm of breast: Secondary | ICD-10-CM | POA: Insufficient documentation

## 2021-03-07 DIAGNOSIS — Z1509 Genetic susceptibility to other malignant neoplasm: Secondary | ICD-10-CM

## 2021-03-07 DIAGNOSIS — Z1502 Genetic susceptibility to malignant neoplasm of ovary: Secondary | ICD-10-CM

## 2021-03-07 NOTE — Progress Notes (Signed)
[place] CONSULT NOTE  Patient Care Team: Gaynelle Arabian, MD as PCP - General (Family Medicine)  CHIEF COMPLAINTS/PURPOSE OF CONSULTATION:  BRCA 2 gene mutation  ASSESSMENT & PLAN:   This is a very pleasant 60 yr old female patient with new diagnosis of breast cancer referred to high risk breast cancer clinic given BRCA 2 mutation.  During her initial evaluation, we have discussed about risk of breast cancer, recommendation to consider bilateral mastectomy, risk of ovarian cancer and recommendations to consider bilateral salpingo-oophorectomy.  We have also discussed the risk of pancreatic cancer and the symptoms to watch for.  Since last visit, she has followed up with plastic surgery and breast surgery at [facility] and she would like to proceed with bilateral mastectomy at [facility].  During exam today, I felt a small area of thickening in the right axilla with no well-defined mass, no palpable breast masses hence have ordered an ultrasound.  Have sent a message to her breast nurse navigator's as well as our nursing team to have this scheduled as soon as possible.  I have encouraged her to stay in touch with [place] and return to clinic for follow-up in about 6 months unless the ultrasound shows any evidence of concern. She was encouraged to let us know if any new symptoms concerning for pancreatic cancer which we have discussed in detail, need for annual dermatological exam, no clear role of screening for ovarian cancer and recommendation to consider bilateral salpingo-oophorectomy.  In the past we have discussed about tamoxifen which she declined. Return to clinic in 6 months.   HISTORY OF PRESENTING ILLNESS:   Linda Jacobson 60 y.o. female is here because of BRCA 2 mutation.  This is a very pleasant 59 year old female patient with no significant past medical history diagnosed with pathogenic BRCA2 mutation referred to high-risk breast cancer  clinic for additional recommendations.   She denies any new health complaints.  She has seen plastic surgery from [facility] and would like to proceed with mastectomy and reconstruction at [facility].  She continues to decline bilateral salpingo-oophorectomy at this time, would like to follow-up with the gynecology on a routine basis.  No new health complaints suggestive of a pancreatic pathology.  She does annual follow-up with dermatology for skin cancers. Rest of the pertinent 10 point ROS reviewed and negative.  REVIEW OF SYSTEMS:   Constitutional: Denies fevers, chills or abnormal night sweats Eyes: Denies blurriness of vision, double vision or watery eyes Ears, nose, mouth, throat, and face: Denies mucositis or sore throat Respiratory: Denies cough, dyspnea or wheezes Cardiovascular: Denies palpitation, chest discomfort or lower extremity swelling Gastrointestinal:  Denies nausea, heartburn or change in bowel habits Skin: Denies abnormal skin rashes Lymphatics: Denies new lymphadenopathy or easy bruising Neurological:Denies numbness, tingling or new weaknesses Behavioral/Psych: Mood is stable, no new changes  All other systems were reviewed with the patient and are negative.  MEDICAL HISTORY:  Past Medical History:  Diagnosis Date   Family history of BRCA gene mutation 07/20/2020   Family history of breast cancer    Family history of liver cancer     SURGICAL HISTORY: History reviewed. No pertinent surgical history.  SOCIAL HISTORY: Social History   Socioeconomic History   Marital status: Married    Spouse name: Not on file   Number of children: Not on file   Years of education: Not on file   Highest education level: Not on file  Occupational History  Not on file  Tobacco Use   Smoking status: Never   Smokeless tobacco: Never  Vaping Use   Vaping Use: Never used  Substance and Sexual Activity   Alcohol use: Yes    Comment: occ   Drug use: Not on file    Sexual activity: Not on file  Other Topics Concern   Not on file  Social History Narrative   Not on file   Social Determinants of Health   Financial Resource Strain: Not on file  Food Insecurity: Not on file  Transportation Needs: Not on file  Physical Activity: Not on file  Stress: Not on file  Social Connections: Not on file  Intimate Partner Violence: Not on file    FAMILY HISTORY: Family History  Problem Relation Age of Onset   Breast cancer Sister 53   Breast cancer Sister 31   Liver cancer Maternal Uncle        dx >50   Breast cancer Maternal Grandmother        unknown age of diagnosis   BRCA 1/2 Sister        BRCA positive   Breast cancer Paternal Grandmother        unknown age of diagnosis    ALLERGIES:  has No Known Allergies.  MEDICATIONS:  Current Outpatient Medications  Medication Sig Dispense Refill   estradiol (ESTRACE) 0.1 MG/GM vaginal cream Place vaginally.     nortriptyline (PAMELOR) 10 MG capsule Take 10 mg by mouth 2 (two) times daily.     SUMAtriptan (IMITREX) 100 MG tablet Take 100 mg by mouth as directed.     No current facility-administered medications for this visit.   PHYSICAL EXAMINATION:  ECOG PERFORMANCE STATUS: 0 - Asymptomatic  Vitals:   03/07/21 1503  BP: 121/77  Pulse: 89  Resp: 17  Temp: (!) 97.5 F (36.4 C)  SpO2: 99%    Filed Weights   03/07/21 1503  Weight: 153 lb 1 oz (69.4 kg)   GENERAL:alert, no distress and comfortable Breast : Bilateral breasts appear normal to inspection.  On palpation, there is an area of thickening in the right axilla with no defined mass.  No masses in the breast.  No other regional adenopathy noted.   Extremities: No lower extremity edema.  LABORATORY DATA:  I have reviewed the data as listed No results found for: WBC, HGB, HCT, MCV, PLT   Chemistry   No results found for: NA, K, CL, CO2, BUN, CREATININE, GLU No results found for: CALCIUM, ALKPHOS, AST, ALT, BILITOT      RADIOGRAPHIC STUDIES: I have personally reviewed the radiological images as listed and agreed with the findings in the report. No results found.  All questions were answered. The patient knows to call the clinic with any problems, questions or concerns. I spent 30 minutes in the care of this patient including history, review of records, counseling and coordination of care. We have once again discussed about risk of breast cancer, ovarian, pancreatic cancer.  We have discussed about screening recommendations, symptoms to watch for and follow-up recommendations.   Benay Pike, MD 03/07/2021 4:29 PM

## 2021-03-08 ENCOUNTER — Telehealth: Payer: Self-pay | Admitting: Surgery

## 2021-03-08 NOTE — Telephone Encounter (Signed)
Per Dr. Remonia Richter orders, I called the [facility] to schedule the pt for a right axilla ultrasound.  The scheduler at the [facility] stated that the pt was due to for a diagnostic mammogram of her right breast, along with the ultrasound.  The pt was schedule for Thursday, Dec. 29, at 11:30.  I left the pt a message with the details of this appointment, as well as letting her know not to wear any deodorant or lotion.  The pt was told to call our office if she had any questions regarding the appointment.

## 2021-03-20 DIAGNOSIS — Z1509 Genetic susceptibility to other malignant neoplasm: Secondary | ICD-10-CM | POA: Diagnosis not present

## 2021-03-20 DIAGNOSIS — Z1239 Encounter for other screening for malignant neoplasm of breast: Secondary | ICD-10-CM | POA: Diagnosis not present

## 2021-03-20 DIAGNOSIS — Z1502 Genetic susceptibility to malignant neoplasm of ovary: Secondary | ICD-10-CM | POA: Diagnosis not present

## 2021-03-20 DIAGNOSIS — Z1501 Genetic susceptibility to malignant neoplasm of breast: Secondary | ICD-10-CM | POA: Diagnosis not present

## 2021-03-21 ENCOUNTER — Ambulatory Visit
Admission: RE | Admit: 2021-03-21 | Discharge: 2021-03-21 | Disposition: A | Discharge status: Home / Self Care | Payer: BC Managed Care – PPO | Source: Ambulatory Visit | Attending: Hematology and Oncology | Admitting: Hematology and Oncology

## 2021-03-21 DIAGNOSIS — R922 Inconclusive mammogram: Secondary | ICD-10-CM | POA: Diagnosis not present

## 2021-03-21 DIAGNOSIS — R2231 Localized swelling, mass and lump, right upper limb: Secondary | ICD-10-CM

## 2021-03-21 DIAGNOSIS — Z1501 Genetic susceptibility to malignant neoplasm of breast: Secondary | ICD-10-CM

## 2021-03-21 DIAGNOSIS — Z1509 Genetic susceptibility to other malignant neoplasm: Secondary | ICD-10-CM

## 2021-03-21 DIAGNOSIS — N6489 Other specified disorders of breast: Secondary | ICD-10-CM | POA: Diagnosis not present

## 2021-03-24 HISTORY — PX: MASTECTOMY: SHX3

## 2021-03-25 ENCOUNTER — Ambulatory Visit
Admission: EM | Admit: 2021-03-25 | Discharge: 2021-03-25 | Disposition: A | Discharge status: Home / Self Care | Payer: BC Managed Care – PPO | Attending: Physician Assistant | Admitting: Physician Assistant

## 2021-03-25 ENCOUNTER — Other Ambulatory Visit: Payer: Self-pay

## 2021-03-25 DIAGNOSIS — N3000 Acute cystitis without hematuria: Secondary | ICD-10-CM | POA: Insufficient documentation

## 2021-03-25 LAB — POCT URINALYSIS DIP (MANUAL ENTRY)
Bilirubin, UA: NEGATIVE
Glucose, UA: NEGATIVE mg/dL
Ketones, POC UA: NEGATIVE mg/dL
Nitrite, UA: NEGATIVE
Protein Ur, POC: 100 mg/dL — AB
Spec Grav, UA: 1.025 (ref 1.010–1.025)
Urobilinogen, UA: 0.2 E.U./dL
pH, UA: 7.5 (ref 5.0–8.0)

## 2021-03-25 MED ORDER — NITROFURANTOIN MONOHYD MACRO 100 MG PO CAPS: 100.0000 mg | ORAL_CAPSULE | Freq: Two times a day (BID) | ORAL | 0 refills | Status: DC

## 2021-03-25 NOTE — ED Provider Notes (Signed)
[facility]    CSN: [id number] Arrival date & time: 03/25/21  1058      History   Chief Complaint Chief Complaint  Patient presents with   Dysuria    HPI Linda Jacobson is a 61 y.o. female.   Patient here today for evaluation of dysuria, urinary frequency that started 3 days ago. She reports today she noted some pink discoloration to urine. She has not tried any treatment for symptoms.  The history is provided by the patient.  Dysuria Associated symptoms: no abdominal pain, no fever, no nausea and no vomiting    Past Medical History:  Diagnosis Date   Family history of BRCA gene mutation 07/20/2020   Family history of breast cancer    Family history of liver cancer     Patient Active Problem List   Diagnosis Date Noted   BRCA2 gene mutation positive in female 08/02/2020   Genetic testing 08/01/2020   Family history of BRCA gene mutation 07/20/2020   Family history of breast cancer    Family history of liver cancer     History reviewed. No pertinent surgical history.  OB History   No obstetric history on file.      Home Medications    Prior to Admission medications   Medication Sig Start Date End Date Taking? Authorizing Provider  nitrofurantoin, macrocrystal-monohydrate, (MACROBID) 100 MG capsule Take 1 capsule (100 mg total) by mouth 2 (two) times daily. 03/25/21  Yes Francene Finders, PA-C  estradiol (ESTRACE) 0.1 MG/GM vaginal cream Place vaginally. 10/03/19   [provider]  nortriptyline (PAMELOR) 10 MG capsule Take 10 mg by mouth 2 (two) times daily. 10/02/19   [provider]  SUMAtriptan (IMITREX) 100 MG tablet Take 100 mg by mouth as directed. 10/02/19   [provider]    Family History Family History  Problem Relation Age of Onset   Breast cancer Sister 33   Breast cancer Sister 71   Liver cancer Maternal Uncle        dx >50   Breast cancer Maternal Grandmother        unknown age of diagnosis   BRCA  1/2 Sister        BRCA positive   Breast cancer Paternal Grandmother        unknown age of diagnosis    Social History Social History   Tobacco Use   Smoking status: Never   Smokeless tobacco: Never  Vaping Use   Vaping Use: Never used  Substance Use Topics   Alcohol use: Yes    Comment: occ     Allergies   Patient has no known allergies.   Review of Systems Review of Systems  Constitutional:  Negative for chills and fever.  Respiratory:  Negative for shortness of breath.   Gastrointestinal:  Negative for abdominal pain, nausea and vomiting.  Genitourinary:  Positive for dysuria and frequency.  Musculoskeletal:  Negative for back pain.    Physical Exam Triage Vital Signs ED Triage Vitals  Enc Vitals Group     BP 03/25/21 1145 111/79     Pulse Rate 03/25/21 1145 99     Resp 03/25/21 1145 18     Temp 03/25/21 1145 98 F (36.7 C)     Temp Source 03/25/21 1145 Oral     SpO2 03/25/21 1145 96 %     Weight --      Height --      Head Circumference --  Peak Flow --      Pain Score 03/25/21 1147 8     Pain Loc --      Pain Edu? --      Excl. in [place]? --    No data found.  Updated Vital Signs BP 111/79 (BP Location: Right Arm)    Pulse 99    Temp 98 F (36.7 C) (Oral)    Resp 18    SpO2 96%     Physical Exam Vitals and nursing note reviewed.  Constitutional:      General: She is not in acute distress.    Appearance: Normal appearance. She is not ill-appearing.  HENT:     Head: Normocephalic and atraumatic.     Nose: Nose normal.  Cardiovascular:     Rate and Rhythm: Normal rate and regular rhythm.     Heart sounds: Normal heart sounds. No murmur heard. Pulmonary:     Effort: Pulmonary effort is normal. No respiratory distress.     Breath sounds: Normal breath sounds. No wheezing, rhonchi or rales.  Abdominal:     General: Abdomen is flat.  Skin:    General: Skin is warm and dry.  Neurological:     Mental Status: She is alert.  Psychiatric:         Mood and Affect: Mood normal.        Thought Content: Thought content normal.     UC Treatments / Results  Labs (all labs ordered are listed, but only abnormal results are displayed) Labs Reviewed  POCT URINALYSIS DIP (MANUAL ENTRY) - Abnormal; Notable for the following components:      Result Value   Clarity, UA cloudy (*)    Blood, UA large (*)    Protein Ur, POC =100 (*)    Leukocytes, UA Large (3+) (*)    All other components within normal limits  URINE CULTURE    EKG   Radiology No results found.  Procedures Procedures (including critical care time)  Medications Ordered in UC Medications - No data to display  Initial Impression / Assessment and Plan / UC Course  I have reviewed the triage vital signs and the nursing notes.  Pertinent labs & imaging results that were available during my care of the patient were reviewed by me and considered in my medical decision making (see chart for details).    UA findings consistent with UTI. Will treat with antibiotic and urine culture prescribed. Recommended follow up if symptoms fail to improve or worsen.   Final Clinical Impressions(s) / UC Diagnoses   Final diagnoses:  Acute cystitis without hematuria   Discharge Instructions   None    ED Prescriptions     Medication Sig Dispense Auth. Provider   nitrofurantoin, macrocrystal-monohydrate, (MACROBID) 100 MG capsule Take 1 capsule (100 mg total) by mouth 2 (two) times daily. 10 capsule Francene Finders, PA-C      PDMP not reviewed this encounter.   Francene Finders, PA-C 03/25/21 1223

## 2021-03-25 NOTE — ED Triage Notes (Signed)
3 day h/o dysuria, urinary frequency. Denies urinary urgency. Notes some hint of pink in urine today. No meds taken.

## 2021-03-26 DIAGNOSIS — J309 Allergic rhinitis, unspecified: Secondary | ICD-10-CM | POA: Diagnosis not present

## 2021-03-26 DIAGNOSIS — J00 Acute nasopharyngitis [common cold]: Secondary | ICD-10-CM | POA: Diagnosis not present

## 2021-03-26 DIAGNOSIS — Z20822 Contact with and (suspected) exposure to covid-19: Secondary | ICD-10-CM | POA: Diagnosis not present

## 2021-03-27 ENCOUNTER — Inpatient Hospital Stay: Payer: BC Managed Care – PPO | Attending: Hematology and Oncology | Admitting: Hematology and Oncology

## 2021-03-27 NOTE — Progress Notes (Deleted)
Linda Jacobson  Patient Care Team: Gaynelle Arabian, MD as PCP - General (Family Medicine)  CHIEF COMPLAINTS/PURPOSE OF CONSULTATION:  BRCA 2 gene mutation  ASSESSMENT & PLAN:   This is a very pleasant 61 yr old female patient with new diagnosis of breast cancer referred to high risk breast cancer clinic given BRCA 2 mutation.  During her initial evaluation, we have discussed about risk of breast cancer, recommendation to consider bilateral mastectomy, risk of ovarian cancer and recommendations to consider bilateral salpingo-oophorectomy.  We have also discussed the risk of pancreatic cancer and the symptoms to watch for.  Since last visit, she has followed up with plastic surgery and breast surgery at [facility] and she would like to proceed with bilateral mastectomy at [facility].  During exam today, I felt a small area of thickening in the right axilla with no well-defined mass, no palpable breast masses hence have ordered an ultrasound.  Have sent a message to her breast nurse navigator's as well as our nursing team to have this scheduled as soon as possible.  I have encouraged her to stay in touch with [place] and return to clinic for follow-up in about 6 months unless the ultrasound shows any evidence of concern. She was encouraged to let us know if any new symptoms concerning for pancreatic cancer which we have discussed in detail, need for annual dermatological exam, no clear role of screening for ovarian cancer and recommendation to consider bilateral salpingo-oophorectomy.  In the past we have discussed about tamoxifen which she declined. Return to clinic in 6 months.   HISTORY OF PRESENTING ILLNESS:   Linda Jacobson 61 y.o. female is here because of BRCA 2 mutation.  This is a very pleasant 61 year old female patient with no significant past medical history diagnosed with pathogenic BRCA2 mutation referred to high-risk breast cancer  clinic for additional recommendations.   She denies any new health complaints.  She has seen plastic surgery from [facility] and would like to proceed with mastectomy and reconstruction at [facility].  She continues to decline bilateral salpingo-oophorectomy at this time, would like to follow-up with the gynecology on a routine basis.  No new health complaints suggestive of a pancreatic pathology.  She does annual follow-up with dermatology for skin cancers. Rest of the pertinent 10 point ROS reviewed and negative.  REVIEW OF SYSTEMS:   Constitutional: Denies fevers, chills or abnormal night sweats Eyes: Denies blurriness of vision, double vision or watery eyes Ears, nose, mouth, throat, and face: Denies mucositis or sore throat Respiratory: Denies cough, dyspnea or wheezes Cardiovascular: Denies palpitation, chest discomfort or lower extremity swelling Gastrointestinal:  Denies nausea, heartburn or change in bowel habits Skin: Denies abnormal skin rashes Lymphatics: Denies new lymphadenopathy or easy bruising Neurological:Denies numbness, tingling or new weaknesses Behavioral/Psych: Mood is stable, no new changes  All other systems were reviewed with the patient and are negative.  MEDICAL HISTORY:  Past Medical History:  Diagnosis Date   Family history of BRCA gene mutation 07/20/2020   Family history of breast cancer    Family history of liver cancer     SURGICAL HISTORY: No past surgical history on file.  SOCIAL HISTORY: Social History   Socioeconomic History   Marital status: Married    Spouse name: Not on file   Number of children: Not on file   Years of education: Not on file   Highest education level: Not on file  Occupational History  Not on file  Tobacco Use   Smoking status: Never   Smokeless tobacco: Never  Vaping Use   Vaping Use: Never used  Substance and Sexual Activity   Alcohol use: Yes    Comment: occ   Drug use: Not on file   Sexual  activity: Not on file  Other Topics Concern   Not on file  Social History Narrative   Not on file   Social Determinants of Health   Financial Resource Strain: Not on file  Food Insecurity: Not on file  Transportation Needs: Not on file  Physical Activity: Not on file  Stress: Not on file  Social Connections: Not on file  Intimate Partner Violence: Not on file    FAMILY HISTORY: Family History  Problem Relation Age of Onset   Breast cancer Sister 69   Breast cancer Sister 42   Liver cancer Maternal Uncle        dx >50   Breast cancer Maternal Grandmother        unknown age of diagnosis   BRCA 1/2 Sister        BRCA positive   Breast cancer Paternal Grandmother        unknown age of diagnosis    ALLERGIES:  has No Known Allergies.  MEDICATIONS:  Current Outpatient Medications  Medication Sig Dispense Refill   estradiol (ESTRACE) 0.1 MG/GM vaginal cream Place vaginally.     nitrofurantoin, macrocrystal-monohydrate, (MACROBID) 100 MG capsule Take 1 capsule (100 mg total) by mouth 2 (two) times daily. 10 capsule 0   nortriptyline (PAMELOR) 10 MG capsule Take 10 mg by mouth 2 (two) times daily.     SUMAtriptan (IMITREX) 100 MG tablet Take 100 mg by mouth as directed.     No current facility-administered medications for this visit.   PHYSICAL EXAMINATION:  ECOG PERFORMANCE STATUS: 0 - Asymptomatic  There were no vitals filed for this visit.   There were no vitals filed for this visit.  GENERAL:alert, no distress and comfortable Breast : Bilateral breasts appear normal to inspection.  On palpation, there is an area of thickening in the right axilla with no defined mass.  No masses in the breast.  No other regional adenopathy noted.   Extremities: No lower extremity edema.  LABORATORY DATA:  I have reviewed the data as listed No results found for: WBC, HGB, HCT, MCV, PLT   Chemistry   No results found for: NA, K, CL, CO2, BUN, CREATININE, GLU No results found for:  CALCIUM, ALKPHOS, AST, ALT, BILITOT     RADIOGRAPHIC STUDIES: I have personally reviewed the radiological images as listed and agreed with the findings in the report. MM DIAG BREAST TOMO UNI RIGHT  Result Date: 03/21/2021 CLINICAL DATA:  61 year old female with palpable thickening in the RIGHT axilla identified on clinical examination. Patient with BRCA 2 positive gene mutation. EXAM: DIGITAL DIAGNOSTIC UNILATERAL RIGHT MAMMOGRAM WITH TOMOSYNTHESIS AND CAD; [place] AXILLARY RIGHT TECHNIQUE: Right digital diagnostic mammography and breast tomosynthesis was performed. The images were evaluated with computer-aided detection.; Targeted ultrasound examination of the right axilla was performed. COMPARISON:  Previous mammograms and breast MR. ACR Breast Density Category c: The breast tissue is heterogeneously dense, which may obscure small masses. FINDINGS: Full field views of the RIGHT breast demonstrate no suspicious mass, distortion or worrisome calcifications. On physical exam, no suspicious palpable abnormalities are identified within the RIGHT axilla. Targeted ultrasound is performed, showing no sonographic abnormalities within the RIGHT axilla. Normal appearing RIGHT axillary lymph nodes  are noted. IMPRESSION: 1. No mammographic, suspicious palpable or sonographic abnormalities within the RIGHT axilla. No abnormal RIGHT axillary lymph nodes. 2. No mammographic evidence of RIGHT breast malignancy. RECOMMENDATION: The patient is scheduled to have bilateral prophylactic mastectomies in February. No further imaging follow-up recommended in less clinically indicated. I have discussed the findings and recommendations with the patient. If applicable, a reminder letter will be sent to the patient regarding the next appointment. BI-RADS CATEGORY  1: Negative. Electronically Signed   By: Margarette Canada M.D.   On: 03/21/2021 12:12  [place] AXILLA RIGHT  Result Date: 03/21/2021 CLINICAL DATA:  61 year old female with palpable  thickening in the RIGHT axilla identified on clinical examination. Patient with BRCA 2 positive gene mutation. EXAM: DIGITAL DIAGNOSTIC UNILATERAL RIGHT MAMMOGRAM WITH TOMOSYNTHESIS AND CAD; [place] AXILLARY RIGHT TECHNIQUE: Right digital diagnostic mammography and breast tomosynthesis was performed. The images were evaluated with computer-aided detection.; Targeted ultrasound examination of the right axilla was performed. COMPARISON:  Previous mammograms and breast MR. ACR Breast Density Category c: The breast tissue is heterogeneously dense, which may obscure small masses. FINDINGS: Full field views of the RIGHT breast demonstrate no suspicious mass, distortion or worrisome calcifications. On physical exam, no suspicious palpable abnormalities are identified within the RIGHT axilla. Targeted ultrasound is performed, showing no sonographic abnormalities within the RIGHT axilla. Normal appearing RIGHT axillary lymph nodes are noted. IMPRESSION: 1. No mammographic, suspicious palpable or sonographic abnormalities within the RIGHT axilla. No abnormal RIGHT axillary lymph nodes. 2. No mammographic evidence of RIGHT breast malignancy. RECOMMENDATION: The patient is scheduled to have bilateral prophylactic mastectomies in February. No further imaging follow-up recommended in less clinically indicated. I have discussed the findings and recommendations with the patient. If applicable, a reminder letter will be sent to the patient regarding the next appointment. BI-RADS CATEGORY  1: Negative. Electronically Signed   By: Margarette Canada M.D.   On: 03/21/2021 12:12   All questions were answered. The patient knows to call the clinic with any problems, questions or concerns. I spent 30 minutes in the care of this patient including history, review of records, counseling and coordination of care. We have once again discussed about risk of breast cancer, ovarian, pancreatic cancer.  We have discussed about screening recommendations,  symptoms to watch for and follow-up recommendations.   Benay Pike, MD 03/27/2021 3:45 PM

## 2021-03-29 LAB — URINE CULTURE: Culture: 30000 — AB

## 2021-04-12 DIAGNOSIS — Z1502 Genetic susceptibility to malignant neoplasm of ovary: Secondary | ICD-10-CM | POA: Diagnosis not present

## 2021-04-12 DIAGNOSIS — Z803 Family history of malignant neoplasm of breast: Secondary | ICD-10-CM | POA: Diagnosis not present

## 2021-04-12 DIAGNOSIS — Z1509 Genetic susceptibility to other malignant neoplasm: Secondary | ICD-10-CM | POA: Diagnosis not present

## 2021-04-12 DIAGNOSIS — Z1501 Genetic susceptibility to malignant neoplasm of breast: Secondary | ICD-10-CM | POA: Diagnosis not present

## 2021-04-17 DIAGNOSIS — Z01818 Encounter for other preprocedural examination: Secondary | ICD-10-CM | POA: Diagnosis not present

## 2021-04-17 DIAGNOSIS — Z1501 Genetic susceptibility to malignant neoplasm of breast: Secondary | ICD-10-CM | POA: Diagnosis not present

## 2021-04-17 DIAGNOSIS — Z1239 Encounter for other screening for malignant neoplasm of breast: Secondary | ICD-10-CM | POA: Diagnosis not present

## 2021-04-17 DIAGNOSIS — Z808 Family history of malignant neoplasm of other organs or systems: Secondary | ICD-10-CM | POA: Diagnosis not present

## 2021-04-17 DIAGNOSIS — Z803 Family history of malignant neoplasm of breast: Secondary | ICD-10-CM | POA: Diagnosis not present

## 2021-04-17 DIAGNOSIS — Z1509 Genetic susceptibility to other malignant neoplasm: Secondary | ICD-10-CM | POA: Diagnosis not present

## 2021-04-17 DIAGNOSIS — Z1502 Genetic susceptibility to malignant neoplasm of ovary: Secondary | ICD-10-CM | POA: Diagnosis not present

## 2021-04-22 DIAGNOSIS — Z0183 Encounter for blood typing: Secondary | ICD-10-CM | POA: Diagnosis not present

## 2021-04-22 DIAGNOSIS — Z01812 Encounter for preprocedural laboratory examination: Secondary | ICD-10-CM | POA: Diagnosis not present

## 2021-05-02 DIAGNOSIS — G8918 Other acute postprocedural pain: Secondary | ICD-10-CM | POA: Diagnosis not present

## 2021-05-02 DIAGNOSIS — N6022 Fibroadenosis of left breast: Secondary | ICD-10-CM | POA: Diagnosis not present

## 2021-05-02 DIAGNOSIS — Z90710 Acquired absence of both cervix and uterus: Secondary | ICD-10-CM | POA: Diagnosis not present

## 2021-05-02 DIAGNOSIS — Z4001 Encounter for prophylactic removal of breast: Secondary | ICD-10-CM | POA: Diagnosis not present

## 2021-05-02 DIAGNOSIS — Z803 Family history of malignant neoplasm of breast: Secondary | ICD-10-CM | POA: Diagnosis not present

## 2021-05-02 DIAGNOSIS — Z1509 Genetic susceptibility to other malignant neoplasm: Secondary | ICD-10-CM | POA: Diagnosis not present

## 2021-05-02 DIAGNOSIS — Z1501 Genetic susceptibility to malignant neoplasm of breast: Secondary | ICD-10-CM | POA: Diagnosis not present

## 2021-05-02 DIAGNOSIS — N6011 Diffuse cystic mastopathy of right breast: Secondary | ICD-10-CM | POA: Diagnosis not present

## 2021-05-02 DIAGNOSIS — K219 Gastro-esophageal reflux disease without esophagitis: Secondary | ICD-10-CM | POA: Diagnosis not present

## 2021-05-02 DIAGNOSIS — N6012 Diffuse cystic mastopathy of left breast: Secondary | ICD-10-CM | POA: Diagnosis not present

## 2021-05-02 DIAGNOSIS — N6021 Fibroadenosis of right breast: Secondary | ICD-10-CM | POA: Diagnosis not present

## 2021-05-02 DIAGNOSIS — Z421 Encounter for breast reconstruction following mastectomy: Secondary | ICD-10-CM | POA: Diagnosis not present

## 2021-05-02 DIAGNOSIS — Z1502 Genetic susceptibility to malignant neoplasm of ovary: Secondary | ICD-10-CM | POA: Diagnosis not present

## 2021-05-30 ENCOUNTER — Telehealth: Payer: Self-pay | Admitting: Hematology and Oncology

## 2021-05-30 NOTE — Telephone Encounter (Signed)
Rescheduled appointment per providers. Patient aware.  ? ?

## 2021-07-15 DIAGNOSIS — Z01812 Encounter for preprocedural laboratory examination: Secondary | ICD-10-CM | POA: Diagnosis not present

## 2021-07-15 DIAGNOSIS — Z8481 Family history of carrier of genetic disease: Secondary | ICD-10-CM | POA: Diagnosis not present

## 2021-07-16 ENCOUNTER — Telehealth: Payer: Self-pay | Admitting: Hematology and Oncology

## 2021-07-16 NOTE — Telephone Encounter (Signed)
Rescheduled appointment per providers. Patient aware.  ? ?

## 2021-07-25 DIAGNOSIS — G43909 Migraine, unspecified, not intractable, without status migrainosus: Secondary | ICD-10-CM | POA: Diagnosis not present

## 2021-07-25 DIAGNOSIS — Z9013 Acquired absence of bilateral breasts and nipples: Secondary | ICD-10-CM | POA: Diagnosis not present

## 2021-07-25 DIAGNOSIS — Z421 Encounter for breast reconstruction following mastectomy: Secondary | ICD-10-CM | POA: Diagnosis not present

## 2021-07-25 DIAGNOSIS — K219 Gastro-esophageal reflux disease without esophagitis: Secondary | ICD-10-CM | POA: Diagnosis not present

## 2021-07-25 DIAGNOSIS — G8918 Other acute postprocedural pain: Secondary | ICD-10-CM | POA: Diagnosis not present

## 2021-07-25 DIAGNOSIS — Z1501 Genetic susceptibility to malignant neoplasm of breast: Secondary | ICD-10-CM | POA: Diagnosis not present

## 2021-07-25 DIAGNOSIS — Z803 Family history of malignant neoplasm of breast: Secondary | ICD-10-CM | POA: Diagnosis not present

## 2021-07-25 DIAGNOSIS — Z45812 Encounter for adjustment or removal of left breast implant: Secondary | ICD-10-CM | POA: Diagnosis not present

## 2021-07-25 DIAGNOSIS — N6481 Ptosis of breast: Secondary | ICD-10-CM | POA: Diagnosis not present

## 2021-07-25 DIAGNOSIS — N641 Fat necrosis of breast: Secondary | ICD-10-CM | POA: Diagnosis not present

## 2021-07-25 DIAGNOSIS — Z800 Family history of malignant neoplasm of digestive organs: Secondary | ICD-10-CM | POA: Diagnosis not present

## 2021-07-25 DIAGNOSIS — Z45811 Encounter for adjustment or removal of right breast implant: Secondary | ICD-10-CM | POA: Diagnosis not present

## 2021-07-25 DIAGNOSIS — N61 Mastitis without abscess: Secondary | ICD-10-CM | POA: Diagnosis not present

## 2021-08-27 DIAGNOSIS — E669 Obesity, unspecified: Secondary | ICD-10-CM | POA: Diagnosis not present

## 2021-08-27 DIAGNOSIS — K219 Gastro-esophageal reflux disease without esophagitis: Secondary | ICD-10-CM | POA: Diagnosis not present

## 2021-08-27 DIAGNOSIS — Z1509 Genetic susceptibility to other malignant neoplasm: Secondary | ICD-10-CM | POA: Diagnosis not present

## 2021-08-27 DIAGNOSIS — Z6826 Body mass index (BMI) 26.0-26.9, adult: Secondary | ICD-10-CM | POA: Diagnosis not present

## 2021-08-27 DIAGNOSIS — Z8489 Family history of other specified conditions: Secondary | ICD-10-CM | POA: Diagnosis not present

## 2021-08-27 DIAGNOSIS — D649 Anemia, unspecified: Secondary | ICD-10-CM | POA: Diagnosis not present

## 2021-08-27 DIAGNOSIS — N736 Female pelvic peritoneal adhesions (postinfective): Secondary | ICD-10-CM | POA: Diagnosis not present

## 2021-08-27 DIAGNOSIS — N135 Crossing vessel and stricture of ureter without hydronephrosis: Secondary | ICD-10-CM | POA: Diagnosis not present

## 2021-08-27 DIAGNOSIS — Z1501 Genetic susceptibility to malignant neoplasm of breast: Secondary | ICD-10-CM | POA: Diagnosis not present

## 2021-08-27 DIAGNOSIS — Z1502 Genetic susceptibility to malignant neoplasm of ovary: Secondary | ICD-10-CM | POA: Diagnosis not present

## 2021-08-27 DIAGNOSIS — Z9013 Acquired absence of bilateral breasts and nipples: Secondary | ICD-10-CM | POA: Diagnosis not present

## 2021-08-28 DIAGNOSIS — Z8489 Family history of other specified conditions: Secondary | ICD-10-CM | POA: Diagnosis not present

## 2021-08-28 DIAGNOSIS — Z1509 Genetic susceptibility to other malignant neoplasm: Secondary | ICD-10-CM | POA: Diagnosis not present

## 2021-08-28 DIAGNOSIS — Z6826 Body mass index (BMI) 26.0-26.9, adult: Secondary | ICD-10-CM | POA: Diagnosis not present

## 2021-08-28 DIAGNOSIS — Z1502 Genetic susceptibility to malignant neoplasm of ovary: Secondary | ICD-10-CM | POA: Diagnosis not present

## 2021-08-28 DIAGNOSIS — N135 Crossing vessel and stricture of ureter without hydronephrosis: Secondary | ICD-10-CM | POA: Diagnosis not present

## 2021-08-28 DIAGNOSIS — Z9013 Acquired absence of bilateral breasts and nipples: Secondary | ICD-10-CM | POA: Diagnosis not present

## 2021-08-28 DIAGNOSIS — K219 Gastro-esophageal reflux disease without esophagitis: Secondary | ICD-10-CM | POA: Diagnosis not present

## 2021-08-28 DIAGNOSIS — N736 Female pelvic peritoneal adhesions (postinfective): Secondary | ICD-10-CM | POA: Diagnosis not present

## 2021-08-28 DIAGNOSIS — D649 Anemia, unspecified: Secondary | ICD-10-CM | POA: Diagnosis not present

## 2021-08-28 DIAGNOSIS — E669 Obesity, unspecified: Secondary | ICD-10-CM | POA: Diagnosis not present

## 2021-08-28 DIAGNOSIS — Z1501 Genetic susceptibility to malignant neoplasm of breast: Secondary | ICD-10-CM | POA: Diagnosis not present

## 2021-09-05 ENCOUNTER — Ambulatory Visit: Payer: BC Managed Care – PPO | Admitting: Hematology and Oncology

## 2021-09-12 ENCOUNTER — Ambulatory Visit: Payer: BC Managed Care – PPO | Admitting: Hematology and Oncology

## 2021-09-19 ENCOUNTER — Ambulatory Visit: Payer: BC Managed Care – PPO | Admitting: Hematology and Oncology

## 2021-10-06 ENCOUNTER — Ambulatory Visit (HOSPITAL_COMMUNITY)
Admission: EM | Admit: 2021-10-06 | Discharge: 2021-10-06 | Disposition: A | Discharge status: Home / Self Care | Payer: BC Managed Care – PPO | Attending: Student | Admitting: Student

## 2021-10-06 DIAGNOSIS — H00012 Hordeolum externum right lower eyelid: Secondary | ICD-10-CM | POA: Diagnosis not present

## 2021-10-06 MED ORDER — ERYTHROMYCIN 5 MG/GM OP OINT: TOPICAL_OINTMENT | OPHTHALMIC | 0 refills | Status: DC

## 2021-10-06 NOTE — Discharge Instructions (Addendum)
-  You have a stye (hordeolum). This is an inflamed oil gland noted on the margin of the eyelid at the level of the eyelashes.  -We are treating it with an antibiotic ointment called erythromycin.  Use this once nightly for about 7 days.  Pull down the lower eyelid, and place about half an inch inside.  This will be messy, so press the remaining ointment around the eye.  You can wash your face with gentle soap and water in the morning to wash off any remaining ointment. -Warm compresses -Seek additional medical attention if symptoms get worse, like eye pain, eye lid swelling, vision changes. Follow-up with your eye doctor if possible, but we're also happy to see you! 

## 2021-10-06 NOTE — ED Triage Notes (Signed)
[facility] presents to [facility] with pain and swelling around right eye. Pt st she believed this was a stye originally but the swelling is progressing downwards and pain is getting worse. Pt reports she is taking motrin for pain

## 2021-10-06 NOTE — ED Provider Notes (Signed)
[facility]    CSN: [id number] Arrival date & time: 10/06/21  1652      History   Chief Complaint Chief Complaint  Patient presents with   Eye Problem    I believe I have a stye, but swelling and pain are going down to my cheek area. - Entered by patient   Facial Swelling    HPI Linda Jacobson is a 61 y.o. female presenting with hordeolum externum right lower lid for 3 days.  History noncontributory.  She states area of localized pain and swelling right lower lid.  She became concerned when she saw swelling below the actual stye.  She denies eye pain, eye pain with movement.  Denies photophobia, foreign body sensation, eye redness, eye crusting in the morning, eye pain, eye pain with movement, injury to eye, vision changes, double vision, excessive tearing, burning eyes.   HPI  Past Medical History:  Diagnosis Date   Family history of BRCA gene mutation 07/20/2020   Family history of breast cancer    Family history of liver cancer     Patient Active Problem List   Diagnosis Date Noted   BRCA2 gene mutation positive in female 08/02/2020   Genetic testing 08/01/2020   Family history of BRCA gene mutation 07/20/2020   Family history of breast cancer    Family history of liver cancer     No past surgical history on file.  OB History   No obstetric history on file.      Home Medications    Prior to Admission medications   Medication Sig Start Date End Date Taking? Authorizing Provider  erythromycin ophthalmic ointment Place a 1/2 inch ribbon of ointment into the lower eyelid of the affected eye at bedtime x7 days 10/06/21  Yes Hazel Sams, PA-C  estradiol (ESTRACE) 0.1 MG/GM vaginal cream Place vaginally. 10/03/19   [provider]  nitrofurantoin, macrocrystal-monohydrate, (MACROBID) 100 MG capsule Take 1 capsule (100 mg total) by mouth 2 (two) times daily. 03/25/21   Francene Finders, PA-C  nortriptyline (PAMELOR) 10 MG capsule Take 10 mg by  mouth 2 (two) times daily. 10/02/19   [provider]  SUMAtriptan (IMITREX) 100 MG tablet Take 100 mg by mouth as directed. 10/02/19   [provider]    Family History Family History  Problem Relation Age of Onset   Breast cancer Sister 67   Breast cancer Sister 80   Liver cancer Maternal Uncle        dx >50   Breast cancer Maternal Grandmother        unknown age of diagnosis   BRCA 1/2 Sister        BRCA positive   Breast cancer Paternal Grandmother        unknown age of diagnosis    Social History Social History   Tobacco Use   Smoking status: Never   Smokeless tobacco: Never  Vaping Use   Vaping Use: Never used  Substance Use Topics   Alcohol use: Yes    Comment: occ     Allergies   Patient has no known allergies.   Review of Systems Review of Systems  Eyes: Negative.   All other systems reviewed and are negative.    Physical Exam Triage Vital Signs ED Triage Vitals [10/06/21 1726]  Enc Vitals Group     BP      Pulse      Resp      Temp  Temp src      SpO2      Weight      Height      Head Circumference      Peak Flow      Pain Score 0     Pain Loc      Pain Edu?      Excl. in [place]?    No data found.  Updated Vital Signs There were no vitals taken for this visit.  Visual Acuity Right Eye Distance:   Left Eye Distance:   Bilateral Distance:    Right Eye Near:   Left Eye Near:    Bilateral Near:     Physical Exam Vitals reviewed.  Constitutional:      General: She is not in acute distress.    Appearance: Normal appearance. She is not ill-appearing.  HENT:     Head: Normocephalic and atraumatic.     Right Ear: Tympanic membrane, ear canal and external ear normal. There is no impacted cerumen.     Left Ear: Tympanic membrane, ear canal and external ear normal. There is no impacted cerumen.     Nose: Nose normal. No congestion.     Mouth/Throat:     Pharynx: Oropharynx is clear. No posterior oropharyngeal  erythema.  Eyes:     General: Lids are everted, no foreign bodies appreciated. Vision grossly intact. Gaze aligned appropriately. No visual field deficit.       Right eye: Hordeolum present. No foreign body or discharge.        Left eye: No foreign body, discharge or hordeolum.     Extraocular Movements: Extraocular movements intact.     Right eye: Normal extraocular motion and no nystagmus.     Left eye: Normal extraocular motion and no nystagmus.     Conjunctiva/sclera: Conjunctivae normal.     Right eye: Right conjunctiva is not injected. No chemosis, exudate or hemorrhage.    Left eye: Left conjunctiva is not injected. No chemosis, exudate or hemorrhage.    Pupils: Pupils are equal, round, and reactive to light.     Visual Fields: Right eye visual fields normal and left eye visual fields normal.     Comments: R lower lid with well circumscribed 2x45m hordeolum. The lower lid is mildly edematous. There is no proptosis, and no diffuse swelling of the upper or lower lid. PERRLA, EOMI without pain. No orbital tenderness.   Cardiovascular:     Rate and Rhythm: Normal rate and regular rhythm.     Heart sounds: Normal heart sounds.  Pulmonary:     Effort: Pulmonary effort is normal.     Breath sounds: Normal breath sounds.  Neurological:     General: No focal deficit present.     Mental Status: She is alert and oriented to person, place, and time.  Psychiatric:        Mood and Affect: Mood normal.        Behavior: Behavior normal.        Thought Content: Thought content normal.        Judgment: Judgment normal.      UC Treatments / Results  Labs (all labs ordered are listed, but only abnormal results are displayed) Labs Reviewed - No data to display  EKG   Radiology No results found.  Procedures Procedures (including critical care time)  Medications Ordered in UC Medications - No data to display  Initial Impression / Assessment and Plan / UC Course  I have reviewed the  triage vital signs and the nursing notes.  Pertinent labs & imaging results that were available during my care of the patient were reviewed by me and considered in my medical decision making (see chart for details).     This patient is a very pleasant 61 y.o. year old female presenting with hordeolum externum R lower lid. Visual acuity grossly intact. The lower lid is mildly edematous but there is no preseptal or orbital cellulitis. Erythromycin ointment sent. Warm compresses. She is in agreement.   Final Clinical Impressions(s) / UC Diagnoses   Final diagnoses:  Hordeolum externum right lower eyelid     Discharge Instructions      -You have a stye (hordeolum). This is an inflamed oil gland noted on the margin of the eyelid at the level of the eyelashes.  -We are treating it with an antibiotic ointment called erythromycin.  Use this once nightly for about 7 days.  Pull down the lower eyelid, and place about half an inch inside.  This will be messy, so press the remaining ointment around the eye.  You can wash your face with gentle soap and water in the morning to wash off any remaining ointment. -Warm compresses -Seek additional medical attention if symptoms get worse, like eye pain, eye lid swelling, vision changes. Follow-up with your eye doctor if possible, but we're also happy to see you!   ED Prescriptions     Medication Sig Dispense Auth. Provider   erythromycin ophthalmic ointment Place a 1/2 inch ribbon of ointment into the lower eyelid of the affected eye at bedtime x7 days 3.5 g Hazel Sams, PA-C      PDMP not reviewed this encounter.   Hazel Sams, PA-C 10/06/21 1739

## 2021-10-22 ENCOUNTER — Encounter: Payer: Self-pay | Admitting: Hematology and Oncology

## 2021-10-22 ENCOUNTER — Other Ambulatory Visit: Payer: Self-pay

## 2021-10-22 ENCOUNTER — Inpatient Hospital Stay: Payer: BC Managed Care – PPO | Attending: Hematology and Oncology | Admitting: Hematology and Oncology

## 2021-10-22 VITALS — BP 136/81 | HR 101 | Temp 98.1°F | Resp 18 | Ht 64.0 in | Wt 152.4 lb

## 2021-10-22 DIAGNOSIS — Z1509 Genetic susceptibility to other malignant neoplasm: Secondary | ICD-10-CM | POA: Diagnosis not present

## 2021-10-22 DIAGNOSIS — Z9013 Acquired absence of bilateral breasts and nipples: Secondary | ICD-10-CM | POA: Diagnosis not present

## 2021-10-22 DIAGNOSIS — Z803 Family history of malignant neoplasm of breast: Secondary | ICD-10-CM | POA: Insufficient documentation

## 2021-10-22 DIAGNOSIS — Z90722 Acquired absence of ovaries, bilateral: Secondary | ICD-10-CM | POA: Insufficient documentation

## 2021-10-22 DIAGNOSIS — Z800 Family history of malignant neoplasm of digestive organs: Secondary | ICD-10-CM | POA: Diagnosis not present

## 2021-10-22 DIAGNOSIS — Z90710 Acquired absence of both cervix and uterus: Secondary | ICD-10-CM

## 2021-10-22 DIAGNOSIS — Z1501 Genetic susceptibility to malignant neoplasm of breast: Secondary | ICD-10-CM | POA: Diagnosis not present

## 2021-10-22 DIAGNOSIS — Z1502 Genetic susceptibility to malignant neoplasm of ovary: Secondary | ICD-10-CM | POA: Diagnosis not present

## 2021-10-22 NOTE — Progress Notes (Signed)
Linda Jacobson CONSULT NOTE  Patient Care Team: Linda Arabian, MD as PCP - General (Family Medicine)  CHIEF COMPLAINTS/PURPOSE OF CONSULTATION:  BRCA 2 gene mutation  ASSESSMENT & PLAN:   This is a very pleasant 61 yr old female patient with new diagnosis of breast cancer referred to high risk breast cancer clinic given BRCA 2 mutation. Since her last visit she underwent bilateral mastectomy with Diep flap reconstruction as well as bilateral salpingo-oophorectomy.  She is recovering well from surgery.  She denies any new complications.  Today we have discussed about also establishing with dermatology for annual skin exam.  No family history of pancreatic cancer reported hence no clear role for additional screening.  She was encouraged to self breast exam and report any changes to [place].  She will otherwise return to clinic in about 6 months or sooner as needed   HISTORY OF PRESENTING ILLNESS:   Linda Jacobson 61 y.o. female is here because of BRCA 2 mutation.  This is a very pleasant 61 year old female patient with no significant past medical history diagnosed with pathogenic BRCA2 mutation referred to high-risk breast cancer clinic for additional recommendations.   She denies any new health complaints.  She underwent bilateral mastectomy and Diep flap reconstruction as well as BSO since her last visit.  She is recovering well.  She has a few minor surgeries upcoming but overall has tolerated everything well so far.  Rest of the pertinent 10 point ROS reviewed and negative.  REVIEW OF SYSTEMS:   Constitutional: Denies fevers, chills or abnormal night sweats Eyes: Denies blurriness of vision, double vision or watery eyes Ears, nose, mouth, throat, and face: Denies mucositis or sore throat Respiratory: Denies cough, dyspnea or wheezes Cardiovascular: Denies palpitation, chest discomfort or lower extremity swelling Gastrointestinal:  Denies nausea, heartburn or change in bowel  habits Skin: Denies abnormal skin rashes Lymphatics: Denies new lymphadenopathy or easy bruising Neurological:Denies numbness, tingling or new weaknesses Behavioral/Psych: Mood is stable, no new changes  All other systems were reviewed with the patient and are negative.  MEDICAL HISTORY:  Past Medical History:  Diagnosis Date   Family history of BRCA gene mutation 07/20/2020   Family history of breast cancer    Family history of liver cancer     SURGICAL HISTORY: No past surgical history on file.  SOCIAL HISTORY: Social History   Socioeconomic History   Marital status: Married    Spouse name: Not on file   Number of children: Not on file   Years of education: Not on file   Highest education level: Not on file  Occupational History   Not on file  Tobacco Use   Smoking status: Never   Smokeless tobacco: Never  Vaping Use   Vaping Use: Never used  Substance and Sexual Activity   Alcohol use: Yes    Comment: occ   Drug use: Not on file   Sexual activity: Not on file  Other Topics Concern   Not on file  Social History Narrative   Not on file   Social Determinants of Health   Financial Resource Strain: Not on file  Food Insecurity: Not on file  Transportation Needs: Not on file  Physical Activity: Not on file  Stress: Not on file  Social Connections: Not on file  Intimate Partner Violence: Not on file    FAMILY HISTORY: Family History  Problem Relation Age of Onset   Breast cancer Sister 20   Breast cancer Sister 23  Liver cancer Maternal Uncle        dx >50   Breast cancer Maternal Grandmother        unknown age of diagnosis   BRCA 1/2 Sister        BRCA positive   Breast cancer Paternal Grandmother        unknown age of diagnosis    ALLERGIES:  has No Known Allergies.  MEDICATIONS:  Current Outpatient Medications  Medication Sig Dispense Refill   erythromycin ophthalmic ointment Place a 1/2 inch ribbon of ointment into the lower eyelid of the  affected eye at bedtime x7 days 3.5 g 0   estradiol (ESTRACE) 0.1 MG/GM vaginal cream Place vaginally.     SUMAtriptan (IMITREX) 100 MG tablet Take 100 mg by mouth as directed.     No current facility-administered medications for this visit.   PHYSICAL EXAMINATION:  ECOG PERFORMANCE STATUS: 0 - Asymptomatic  Vitals:   10/22/21 1557  BP: 136/81  Pulse: (!) 101  Resp: 18  Temp: 98.1 F (36.7 C)  SpO2: 98%    Filed Weights   10/22/21 1557  Weight: 152 lb 6.4 oz (69.1 kg)   GENERAL:alert, no distress and comfortable Breast : Bilateral breast with reconstruction, pending nipple tattooing.  LABORATORY DATA:  I have reviewed the data as listed No results found for: "WBC", "HGB", "HCT", "MCV", "PLT"   Chemistry   No results found for: "NA", "K", "CL", "CO2", "BUN", "CREATININE", "GLU" No results found for: "CALCIUM", "ALKPHOS", "AST", "ALT", "BILITOT"     RADIOGRAPHIC STUDIES: I have personally reviewed the radiological images as listed and agreed with the findings in the report. No results found.  All questions were answered. The patient knows to call the clinic with any problems, questions or concerns. I spent 20 minutes in the care of this patient including history, review of records, counseling and coordination of care. We have once again discussed about screening recommendations with BRCA2.   Linda Pike, MD 10/22/2021 4:13 PM

## 2021-12-13 DIAGNOSIS — Z421 Encounter for breast reconstruction following mastectomy: Secondary | ICD-10-CM | POA: Diagnosis not present

## 2021-12-31 DIAGNOSIS — Z421 Encounter for breast reconstruction following mastectomy: Secondary | ICD-10-CM | POA: Diagnosis not present

## 2022-01-15 DIAGNOSIS — Z13220 Encounter for screening for lipoid disorders: Secondary | ICD-10-CM | POA: Diagnosis not present

## 2022-01-15 DIAGNOSIS — Z0000 Encounter for general adult medical examination without abnormal findings: Secondary | ICD-10-CM | POA: Diagnosis not present

## 2022-01-15 DIAGNOSIS — Z23 Encounter for immunization: Secondary | ICD-10-CM | POA: Diagnosis not present

## 2022-01-15 DIAGNOSIS — Z131 Encounter for screening for diabetes mellitus: Secondary | ICD-10-CM | POA: Diagnosis not present

## 2022-05-01 ENCOUNTER — Inpatient Hospital Stay: Payer: BC Managed Care – PPO | Attending: Hematology and Oncology | Admitting: Hematology and Oncology

## 2022-05-01 ENCOUNTER — Other Ambulatory Visit: Payer: Self-pay

## 2022-05-01 VITALS — BP 130/81 | HR 85 | Temp 98.0°F | Resp 16 | Ht 64.0 in | Wt 159.1 lb

## 2022-05-01 DIAGNOSIS — Z803 Family history of malignant neoplasm of breast: Secondary | ICD-10-CM | POA: Insufficient documentation

## 2022-05-01 DIAGNOSIS — Z1501 Genetic susceptibility to malignant neoplasm of breast: Secondary | ICD-10-CM | POA: Diagnosis not present

## 2022-05-01 DIAGNOSIS — C50919 Malignant neoplasm of unspecified site of unspecified female breast: Secondary | ICD-10-CM

## 2022-05-01 DIAGNOSIS — Z800 Family history of malignant neoplasm of digestive organs: Secondary | ICD-10-CM | POA: Diagnosis not present

## 2022-05-01 DIAGNOSIS — Z9013 Acquired absence of bilateral breasts and nipples: Secondary | ICD-10-CM | POA: Diagnosis not present

## 2022-05-01 DIAGNOSIS — Z1502 Genetic susceptibility to malignant neoplasm of ovary: Secondary | ICD-10-CM | POA: Diagnosis not present

## 2022-05-01 DIAGNOSIS — Z1509 Genetic susceptibility to other malignant neoplasm: Secondary | ICD-10-CM | POA: Insufficient documentation

## 2022-05-01 NOTE — Progress Notes (Signed)
[facility] CONSULT NOTE  Patient Care Team: Gaynelle Arabian, MD as PCP - General (Family Medicine)  CHIEF COMPLAINTS/PURPOSE OF CONSULTATION:  BRCA 2 gene mutation  ASSESSMENT & PLAN:   This is a very pleasant 62 yr old female patient with new diagnosis of breast cancer referred to high risk breast cancer clinic given BRCA 2 mutation. Since her last visit she is almost done with plastic surgery and may need some more, so far everything is going well. Today we have once again discussed about also establishing with dermatology for annual skin exam.  She says, she forgot about this. No family history of pancreatic cancer reported hence no clear role for additional screening.   Breast exam today with excellent cosmetic outcome, no palpable masses or regional adenopathy. We have encouraged her to do SBE monthly and report any changes to [place]. She is otherwise s/p BSO, so no role for additional screening. RTC in one yr or sooner as needed.  HISTORY OF PRESENTING ILLNESS:   Linda Jacobson 62 y.o. female is here because of BRCA 2 mutation.  This is a very pleasant 62 year old female patient with no significant past medical history diagnosed with pathogenic BRCA2 mutation referred to high-risk breast cancer clinic for additional recommendations.   She denies any new health complaints.  She underwent bilateral mastectomy and Diep flap reconstruction as well as BSO for BRCA 2. She will continue working with plastic surgery, has some more reconstructions planned. Rest of the pertinent 10 point ROS reviewed and neg.  REVIEW OF SYSTEMS:   Constitutional: Denies fevers, chills or abnormal night sweats Eyes: Denies blurriness of vision, double vision or watery eyes Ears, nose, mouth, throat, and face: Denies mucositis or sore throat Respiratory: Denies cough, dyspnea or wheezes Cardiovascular: Denies palpitation, chest discomfort or lower extremity swelling Gastrointestinal:  Denies  nausea, heartburn or change in bowel habits Skin: Denies abnormal skin rashes Lymphatics: Denies new lymphadenopathy or easy bruising Neurological:Denies numbness, tingling or new weaknesses Behavioral/Psych: Mood is stable, no new changes  All other systems were reviewed with the patient and are negative.  MEDICAL HISTORY:  Past Medical History:  Diagnosis Date   Family history of BRCA gene mutation 07/20/2020   Family history of breast cancer    Family history of liver cancer     SURGICAL HISTORY: No past surgical history on file.  SOCIAL HISTORY: Social History   Socioeconomic History   Marital status: Married    Spouse name: Not on file   Number of children: Not on file   Years of education: Not on file   Highest education level: Not on file  Occupational History   Not on file  Tobacco Use   Smoking status: Never   Smokeless tobacco: Never  Vaping Use   Vaping Use: Never used  Substance and Sexual Activity   Alcohol use: Yes    Comment: occ   Drug use: Not on file   Sexual activity: Not on file  Other Topics Concern   Not on file  Social History Narrative   Not on file   Social Determinants of Health   Financial Resource Strain: Not on file  Food Insecurity: Not on file  Transportation Needs: Not on file  Physical Activity: Not on file  Stress: Not on file  Social Connections: Not on file  Intimate Partner Violence: Not on file    FAMILY HISTORY: Family History  Problem Relation Age of Onset   Breast cancer Sister 55  Breast cancer Sister 69   Liver cancer Maternal Uncle        dx >50   Breast cancer Maternal Grandmother        unknown age of diagnosis   BRCA 1/2 Sister        BRCA positive   Breast cancer Paternal Grandmother        unknown age of diagnosis    ALLERGIES:  has No Known Allergies.  MEDICATIONS:  Current Outpatient Medications  Medication Sig Dispense Refill   erythromycin ophthalmic ointment Place a 1/2 inch ribbon of  ointment into the lower eyelid of the affected eye at bedtime x7 days 3.5 g 0   estradiol (ESTRACE) 0.1 MG/GM vaginal cream Place vaginally.     SUMAtriptan (IMITREX) 100 MG tablet Take 100 mg by mouth as directed.     No current facility-administered medications for this visit.   PHYSICAL EXAMINATION:  ECOG PERFORMANCE STATUS: 0 - Asymptomatic  Vitals:   05/01/22 1600  BP: 130/81  Pulse: 85  Resp: 16  Temp: 98 F (36.7 C)  SpO2: 98%     Filed Weights   05/01/22 1600  Weight: 159 lb 1.6 oz (72.2 kg)    GENERAL:alert, no distress and comfortable Breast : Bilateral breast with reconstruction, excellent cosmetic outcome so far. No palpable masses or regional adenopathy Chest: CTA bilaterally Heart: RRR Abdomen: soft,non tender. Extremities: No edema.  LABORATORY DATA:  I have reviewed the data as listed No results found for: "WBC", "HGB", "HCT", "MCV", "PLT"   Chemistry   No results found for: "NA", "K", "CL", "CO2", "BUN", "CREATININE", "GLU" No results found for: "CALCIUM", "ALKPHOS", "AST", "ALT", "BILITOT"     RADIOGRAPHIC STUDIES: I have personally reviewed the radiological images as listed and agreed with the findings in the report. No results found.  All questions were answered. The patient knows to call the clinic with any problems, questions or concerns. I spent 20 minutes in the care of this patient including history, review of records, counseling and coordination of care. We have once again discussed about screening recommendations with BRCA2.   Benay Pike, MD 05/02/2022 9:20 PM

## 2022-05-02 ENCOUNTER — Telehealth: Payer: Self-pay | Admitting: Hematology and Oncology

## 2022-05-02 ENCOUNTER — Encounter: Payer: Self-pay | Admitting: Hematology and Oncology

## 2022-05-02 NOTE — Telephone Encounter (Signed)
Spoke with patient confirming appointment for next year

## 2022-05-20 DIAGNOSIS — Z01818 Encounter for other preprocedural examination: Secondary | ICD-10-CM | POA: Diagnosis not present

## 2022-05-20 DIAGNOSIS — G43909 Migraine, unspecified, not intractable, without status migrainosus: Secondary | ICD-10-CM | POA: Diagnosis not present

## 2022-05-20 DIAGNOSIS — K219 Gastro-esophageal reflux disease without esophagitis: Secondary | ICD-10-CM | POA: Diagnosis not present

## 2022-06-10 DIAGNOSIS — Z853 Personal history of malignant neoplasm of breast: Secondary | ICD-10-CM | POA: Diagnosis not present

## 2022-06-10 DIAGNOSIS — L905 Scar conditions and fibrosis of skin: Secondary | ICD-10-CM | POA: Diagnosis not present

## 2022-06-10 DIAGNOSIS — Z421 Encounter for breast reconstruction following mastectomy: Secondary | ICD-10-CM | POA: Diagnosis not present

## 2022-06-17 DIAGNOSIS — Z09 Encounter for follow-up examination after completed treatment for conditions other than malignant neoplasm: Secondary | ICD-10-CM | POA: Diagnosis not present

## 2022-07-03 DIAGNOSIS — Z421 Encounter for breast reconstruction following mastectomy: Secondary | ICD-10-CM | POA: Diagnosis not present

## 2022-08-13 DIAGNOSIS — Z421 Encounter for breast reconstruction following mastectomy: Secondary | ICD-10-CM | POA: Diagnosis not present

## 2022-10-14 DIAGNOSIS — Z1283 Encounter for screening for malignant neoplasm of skin: Secondary | ICD-10-CM | POA: Diagnosis not present

## 2022-10-14 DIAGNOSIS — L308 Other specified dermatitis: Secondary | ICD-10-CM | POA: Diagnosis not present

## 2022-10-14 DIAGNOSIS — D225 Melanocytic nevi of trunk: Secondary | ICD-10-CM | POA: Diagnosis not present

## 2022-10-14 DIAGNOSIS — D485 Neoplasm of uncertain behavior of skin: Secondary | ICD-10-CM | POA: Diagnosis not present

## 2022-10-14 DIAGNOSIS — L821 Other seborrheic keratosis: Secondary | ICD-10-CM | POA: Diagnosis not present

## 2022-10-29 DIAGNOSIS — R6889 Other general symptoms and signs: Secondary | ICD-10-CM | POA: Diagnosis not present

## 2022-10-29 DIAGNOSIS — R202 Paresthesia of skin: Secondary | ICD-10-CM | POA: Diagnosis not present

## 2022-10-29 DIAGNOSIS — G43909 Migraine, unspecified, not intractable, without status migrainosus: Secondary | ICD-10-CM | POA: Diagnosis not present

## 2022-11-05 ENCOUNTER — Other Ambulatory Visit: Payer: Self-pay | Admitting: Physician Assistant

## 2022-11-05 DIAGNOSIS — F4322 Adjustment disorder with anxiety: Secondary | ICD-10-CM | POA: Diagnosis not present

## 2022-11-05 DIAGNOSIS — R5383 Other fatigue: Secondary | ICD-10-CM

## 2022-11-05 DIAGNOSIS — R202 Paresthesia of skin: Secondary | ICD-10-CM

## 2022-11-18 DIAGNOSIS — F4322 Adjustment disorder with anxiety: Secondary | ICD-10-CM | POA: Diagnosis not present

## 2022-11-26 DIAGNOSIS — F4322 Adjustment disorder with anxiety: Secondary | ICD-10-CM | POA: Diagnosis not present

## 2022-12-10 DIAGNOSIS — F4322 Adjustment disorder with anxiety: Secondary | ICD-10-CM | POA: Diagnosis not present

## 2022-12-13 ENCOUNTER — Ambulatory Visit
Admission: RE | Admit: 2022-12-13 | Discharge: 2022-12-13 | Disposition: A | Discharge status: Home / Self Care | Payer: BC Managed Care – PPO | Source: Ambulatory Visit | Attending: Physician Assistant | Admitting: Physician Assistant

## 2022-12-13 DIAGNOSIS — R202 Paresthesia of skin: Secondary | ICD-10-CM

## 2022-12-13 DIAGNOSIS — R5383 Other fatigue: Secondary | ICD-10-CM

## 2022-12-13 DIAGNOSIS — R6889 Other general symptoms and signs: Secondary | ICD-10-CM | POA: Diagnosis not present

## 2022-12-13 MED ORDER — GADOPICLENOL 0.5 MMOL/ML IV SOLN
7.0000 mL | Freq: Once | INTRAVENOUS | Status: AC | PRN
  Administered 2022-12-13: 7 mL via INTRAVENOUS

## 2023-01-07 DIAGNOSIS — F4322 Adjustment disorder with anxiety: Secondary | ICD-10-CM | POA: Diagnosis not present

## 2023-01-21 ENCOUNTER — Other Ambulatory Visit: Payer: Self-pay | Admitting: Family Medicine

## 2023-01-21 DIAGNOSIS — Z87448 Personal history of other diseases of urinary system: Secondary | ICD-10-CM | POA: Diagnosis not present

## 2023-01-21 DIAGNOSIS — Z0000 Encounter for general adult medical examination without abnormal findings: Secondary | ICD-10-CM | POA: Diagnosis not present

## 2023-01-21 DIAGNOSIS — Z1159 Encounter for screening for other viral diseases: Secondary | ICD-10-CM | POA: Diagnosis not present

## 2023-01-21 DIAGNOSIS — R5381 Other malaise: Secondary | ICD-10-CM

## 2023-01-21 DIAGNOSIS — N390 Urinary tract infection, site not specified: Secondary | ICD-10-CM | POA: Diagnosis not present

## 2023-01-21 DIAGNOSIS — Z13220 Encounter for screening for lipoid disorders: Secondary | ICD-10-CM | POA: Diagnosis not present

## 2023-01-21 DIAGNOSIS — G43909 Migraine, unspecified, not intractable, without status migrainosus: Secondary | ICD-10-CM | POA: Diagnosis not present

## 2023-01-24 ENCOUNTER — Ambulatory Visit
Admission: RE | Admit: 2023-01-24 | Discharge: 2023-01-24 | Disposition: A | Discharge status: Home / Self Care | Payer: BC Managed Care – PPO | Source: Ambulatory Visit | Attending: Family Medicine | Admitting: Family Medicine

## 2023-01-24 DIAGNOSIS — M8588 Other specified disorders of bone density and structure, other site: Secondary | ICD-10-CM | POA: Diagnosis not present

## 2023-01-24 DIAGNOSIS — N958 Other specified menopausal and perimenopausal disorders: Secondary | ICD-10-CM | POA: Diagnosis not present

## 2023-01-24 DIAGNOSIS — R5381 Other malaise: Secondary | ICD-10-CM

## 2023-01-24 DIAGNOSIS — Z90722 Acquired absence of ovaries, bilateral: Secondary | ICD-10-CM | POA: Diagnosis not present

## 2023-01-28 DIAGNOSIS — F4322 Adjustment disorder with anxiety: Secondary | ICD-10-CM | POA: Diagnosis not present

## 2023-02-17 DIAGNOSIS — F4322 Adjustment disorder with anxiety: Secondary | ICD-10-CM | POA: Diagnosis not present

## 2023-03-03 DIAGNOSIS — F4322 Adjustment disorder with anxiety: Secondary | ICD-10-CM | POA: Diagnosis not present

## 2023-04-01 DIAGNOSIS — F4322 Adjustment disorder with anxiety: Secondary | ICD-10-CM | POA: Diagnosis not present

## 2023-04-06 DIAGNOSIS — Z09 Encounter for follow-up examination after completed treatment for conditions other than malignant neoplasm: Secondary | ICD-10-CM | POA: Diagnosis not present

## 2023-04-06 DIAGNOSIS — K5730 Diverticulosis of large intestine without perforation or abscess without bleeding: Secondary | ICD-10-CM | POA: Diagnosis not present

## 2023-04-06 DIAGNOSIS — Z860101 Personal history of adenomatous and serrated colon polyps: Secondary | ICD-10-CM | POA: Diagnosis not present

## 2023-04-06 DIAGNOSIS — K648 Other hemorrhoids: Secondary | ICD-10-CM | POA: Diagnosis not present

## 2023-04-07 DIAGNOSIS — R3121 Asymptomatic microscopic hematuria: Secondary | ICD-10-CM | POA: Diagnosis not present

## 2023-04-07 DIAGNOSIS — N393 Stress incontinence (female) (male): Secondary | ICD-10-CM | POA: Diagnosis not present

## 2023-04-14 DIAGNOSIS — K219 Gastro-esophageal reflux disease without esophagitis: Secondary | ICD-10-CM | POA: Insufficient documentation

## 2023-04-14 DIAGNOSIS — G43909 Migraine, unspecified, not intractable, without status migrainosus: Secondary | ICD-10-CM | POA: Insufficient documentation

## 2023-04-16 ENCOUNTER — Encounter: Payer: Self-pay | Admitting: Neurology

## 2023-04-16 ENCOUNTER — Ambulatory Visit: Payer: BC Managed Care – PPO | Admitting: Neurology

## 2023-04-16 VITALS — BP 122/80 | HR 78 | Ht 64.0 in | Wt 159.0 lb

## 2023-04-16 DIAGNOSIS — G43709 Chronic migraine without aura, not intractable, without status migrainosus: Secondary | ICD-10-CM

## 2023-04-16 DIAGNOSIS — R202 Paresthesia of skin: Secondary | ICD-10-CM | POA: Diagnosis not present

## 2023-04-16 DIAGNOSIS — F4322 Adjustment disorder with anxiety: Secondary | ICD-10-CM | POA: Diagnosis not present

## 2023-04-16 MED ORDER — RIZATRIPTAN BENZOATE 10 MG PO TBDP: 10.0000 mg | ORAL_TABLET | ORAL | 6 refills | Status: AC | PRN

## 2023-04-16 NOTE — Progress Notes (Signed)
Chief Complaint  Patient presents with   New Patient (Initial Visit)    Pt in 15, here with husband Trey Paula  Pt is referred by [employer] PA for migraines and paresthesias. Pt states she does not sweat, states when going outside the sunlight makes her back tingle. States this happenes when she gets overly hot.       ASSESSMENT AND PLAN  [name] is a 63 y.o. female   Chronic migraine headache  Complains of side effect with Imitrex, feeling exhausted after work, which did take care of her headache, will try Maxalt as needed  Intermittent trunk paresthesia, often associated with heat, sun exposure  Laboratory evaluation to rule out treatable etiology,  Normal neurological examination, discussed with patient, will hold off imaging study at this point,  DIAGNOSTIC DATA (LABS, IMAGING, TESTING) - I reviewed patient records, labs, notes, testing and imaging myself where available.   MEDICAL HISTORY:  Linda Jacobson is a 63 year old female, seen in request by her primary care from [facility], [place], for evaluation of migraine, paresthesia, initial evaluation was on April 16, 2023  History is obtained from the patient and review of electronic medical records. I personally reviewed pertinent available imaging films in PACS.   PMHx of  GERD   She reports long history of chronic headache, sometimes with light, noise sensitivity, often started at the left side occipital region, then spreading to become holoacranial pressure sometimes pounding headaches, Imitrex as needed was helpful, but she often feel exhausted afterwards, she has headache about couple times a week  Since 2024, besides her frequent headaches, she also developed symptoms of intermittent sweaty sensation in her upper back, often triggered by hot, sun exposure, she felt sweaty at her posterior mid trunk, pinprick sensation, tightness all over her body, feeling exhausted, not necessarily associated with  headache, she has to remove herself from the heat exposure,  PHYSICAL EXAM:   Vitals:   04/16/23 1424  BP: 122/80  Pulse: 78  Weight: 159 lb (72.1 kg)  Height: 5\' 4"  (1.626 m)   Not recorded     Body mass index is 27.29 kg/m.  PHYSICAL EXAMNIATION:  Gen: NAD, conversant, well nourised, well groomed                     Cardiovascular: Regular rate rhythm, no peripheral edema, warm, nontender. Eyes: Conjunctivae clear without exudates or hemorrhage Neck: Supple, no carotid bruits. Pulmonary: Clear to auscultation bilaterally   NEUROLOGICAL EXAM:  MENTAL STATUS: Speech/cognition: Awake, alert, oriented to history taking and casual conversation CRANIAL NERVES: CN II: Visual fields are full to confrontation. Pupils are round equal and briskly reactive to light. CN III, IV, VI: extraocular movement are normal. No ptosis. CN V: Facial sensation is intact to light touch CN VII: Face is symmetric with normal eye closure  CN VIII: Hearing is normal to causal conversation. CN IX, X: Phonation is normal. CN XI: Head turning and shoulder shrug are intact  MOTOR: There is no pronator drift of out-stretched arms. Muscle bulk and tone are normal. Muscle strength is normal.  REFLEXES: Reflexes are 2+ and symmetric at the biceps, triceps, knees, and ankles. Plantar responses are flexor.  SENSORY: Intact to light touch, pinprick and vibratory sensation are intact in fingers and toes.  COORDINATION: There is no trunk or limb dysmetria noted.  GAIT/STANCE: Posture is normal. Gait is steady with normal steps, base, arm swing, and turning. Heel and toe walking are  normal. Tandem gait is normal.  Romberg is absent.  REVIEW OF SYSTEMS:  Full 14 system review of systems performed and notable only for as above All other review of systems were negative.   ALLERGIES: No Known Allergies  HOME MEDICATIONS: Current Outpatient Medications  Medication Sig Dispense Refill    cholecalciferol (VITAMIN D3) 25 MCG (1000 UNIT) tablet Take 1,000 Units by mouth daily.     estradiol (ESTRACE) 0.1 MG/GM vaginal cream Place vaginally.     Glucosamine-Chondroitin 500-400 MG CAPS Take 2 capsules by mouth daily.     magnesium 30 MG tablet Take 30 mg by mouth 2 (two) times daily.     omeprazole (PRILOSEC) 20 MG capsule Take 20 mg by mouth daily.     SUMAtriptan (IMITREX) 100 MG tablet Take 100 mg by mouth as directed.     vitamin C (ASCORBIC ACID) 250 MG tablet Take 250 mg by mouth daily.     No current facility-administered medications for this visit.    PAST MEDICAL HISTORY: Past Medical History:  Diagnosis Date   Family history of BRCA gene mutation 07/20/2020   Family history of breast cancer    Family history of liver cancer     PAST SURGICAL HISTORY: Past Surgical History:  Procedure Laterality Date   BREAST SURGERY Bilateral    reconstruction / touch up / deip flap   LAPAROSCOPIC HYSTERECTOMY  2008   MASTECTOMY Bilateral 2023   NM RENAL LASIX ([facility] HX)      FAMILY HISTORY: Family History  Problem Relation Age of Onset   Breast cancer Sister 26   Breast cancer Sister 41   Liver cancer Maternal Uncle        dx >50   Breast cancer Maternal Grandmother        unknown age of diagnosis   BRCA 1/2 Sister        BRCA positive   Breast cancer Paternal Grandmother        unknown age of diagnosis    SOCIAL HISTORY: Social History   Socioeconomic History   Marital status: Married    Spouse name: Not on file   Number of children: Not on file   Years of education: Not on file   Highest education level: Not on file  Occupational History   Not on file  Tobacco Use   Smoking status: Never   Smokeless tobacco: Never  Vaping Use   Vaping status: Never Used  Substance and Sexual Activity   Alcohol use: Yes    Alcohol/week: 1.0 standard drink of alcohol    Types: 1 Glasses of wine per week    Comment: occ   Drug use: Never   Sexual activity: Not on  file  Other Topics Concern   Not on file  Social History Narrative   Not on file   Social Drivers of Health   Financial Resource Strain: Not on file  Food Insecurity: No Food Insecurity (08/27/2021)   [facility] to [facility]24[facility]22[facility] visits prior to 05/24/2022.   Hunger Vital Sign    Worried About Running Out of Food in the Last Year: Never true    Ran Out of Food in the Last Year: Never true  Transportation Needs: Not on file  Physical Activity: Not on file  Stress: Not on file  Social Connections: Not on file  Intimate Partner Violence: Not on file      Breelynn Bankert  Terrace Arabia, M.D. Ph.D.  [facility] [address], [address] [place], [place] [address] Ph: [phone] Fax: [phone]  CC:  Milus Height, [place] [address] [place],  [place] [address]  Blair Heys, MD (Inactive)

## 2023-04-17 LAB — CBC WITH DIFFERENTIAL/PLATELET
Basophils Absolute: 0 10*3/uL (ref 0.0–0.2)
Basos: 0 %
EOS (ABSOLUTE): 0.1 10*3/uL (ref 0.0–0.4)
Eos: 2 %
Hematocrit: 40 % (ref 34.0–46.6)
Hemoglobin: 13.7 g/dL (ref 11.1–15.9)
Immature Grans (Abs): 0 10*3/uL (ref 0.0–0.1)
Immature Granulocytes: 0 %
Lymphocytes Absolute: 1.3 10*3/uL (ref 0.7–3.1)
Lymphs: 23 %
MCH: 32.1 pg (ref 26.6–33.0)
MCHC: 34.3 g/dL (ref 31.5–35.7)
MCV: 94 fL (ref 79–97)
Monocytes Absolute: 0.5 10*3/uL (ref 0.1–0.9)
Monocytes: 9 %
Neutrophils Absolute: 3.7 10*3/uL (ref 1.4–7.0)
Neutrophils: 66 %
Platelets: 220 10*3/uL (ref 150–450)
RBC: 4.27 x10E6/uL (ref 3.77–5.28)
RDW: 12.3 % (ref 11.7–15.4)
WBC: 5.6 10*3/uL (ref 3.4–10.8)

## 2023-04-17 LAB — VITAMIN B12: Vitamin B-12: 470 pg/mL (ref 232–1245)

## 2023-04-17 LAB — HGB A1C W/O EAG: Hgb A1c MFr Bld: 5.5 % (ref 4.8–5.6)

## 2023-04-17 LAB — COMPREHENSIVE METABOLIC PANEL
ALT: 19 [IU]/L (ref 0–32)
AST: 18 [IU]/L (ref 0–40)
Albumin: 4.5 g/dL (ref 3.8–4.9)
Alkaline Phosphatase: 80 [IU]/L (ref 44–121)
BUN/Creatinine Ratio: 29 — ABNORMAL HIGH (ref 12–28)
BUN: 19 mg/dL (ref 8–27)
Bilirubin Total: 0.3 mg/dL (ref 0.0–1.2)
CO2: 25 mmol/L (ref 20–29)
Calcium: 9.7 mg/dL (ref 8.7–10.3)
Chloride: 102 mmol/L (ref 96–106)
Creatinine, Ser: 0.66 mg/dL (ref 0.57–1.00)
Globulin, Total: 2.4 g/dL (ref 1.5–4.5)
Glucose: 92 mg/dL (ref 70–99)
Potassium: 4.1 mmol/L (ref 3.5–5.2)
Sodium: 143 mmol/L (ref 134–144)
Total Protein: 6.9 g/dL (ref 6.0–8.5)
eGFR: 100 mL/min/{1.73_m2} (ref >59)

## 2023-04-17 LAB — ANA W/REFLEX IF POSITIVE: Anti Nuclear Antibody (ANA): NEGATIVE

## 2023-04-17 LAB — VITAMIN D 25 HYDROXY (VIT D DEFICIENCY, FRACTURES): Vit D, 25-Hydroxy: 43.9 ng/mL (ref 30.0–100.0)

## 2023-04-17 LAB — C-REACTIVE PROTEIN: CRP: 7 mg/L (ref 0–10)

## 2023-04-17 LAB — SEDIMENTATION RATE: Sed Rate: 8 mm/h (ref 0–40)

## 2023-04-17 LAB — TSH: TSH: 1.8 u[IU]/mL (ref 0.450–4.500)

## 2023-04-20 ENCOUNTER — Encounter: Payer: Self-pay | Admitting: Neurology

## 2023-04-27 ENCOUNTER — Telehealth: Payer: Self-pay | Admitting: Hematology and Oncology

## 2023-04-27 NOTE — Telephone Encounter (Signed)
 Left patient a vm regarding upcoming appointment

## 2023-05-01 DIAGNOSIS — K5730 Diverticulosis of large intestine without perforation or abscess without bleeding: Secondary | ICD-10-CM | POA: Diagnosis not present

## 2023-05-01 DIAGNOSIS — R3121 Asymptomatic microscopic hematuria: Secondary | ICD-10-CM | POA: Diagnosis not present

## 2023-05-01 DIAGNOSIS — R932 Abnormal findings on diagnostic imaging of liver and biliary tract: Secondary | ICD-10-CM | POA: Diagnosis not present

## 2023-05-01 DIAGNOSIS — R3129 Other microscopic hematuria: Secondary | ICD-10-CM | POA: Diagnosis not present

## 2023-05-04 ENCOUNTER — Ambulatory Visit: Payer: BC Managed Care – PPO | Admitting: Hematology and Oncology

## 2023-05-06 ENCOUNTER — Ambulatory Visit: Payer: BC Managed Care – PPO | Admitting: Hematology and Oncology

## 2023-05-06 DIAGNOSIS — F4322 Adjustment disorder with anxiety: Secondary | ICD-10-CM | POA: Diagnosis not present

## 2023-05-14 ENCOUNTER — Inpatient Hospital Stay: Payer: BC Managed Care – PPO | Attending: Hematology and Oncology | Admitting: Hematology and Oncology

## 2023-05-14 ENCOUNTER — Encounter: Payer: Self-pay | Admitting: Hematology and Oncology

## 2023-05-14 VITALS — BP 127/66 | HR 99 | Temp 97.6°F | Resp 16 | Wt 159.6 lb

## 2023-05-14 DIAGNOSIS — Z800 Family history of malignant neoplasm of digestive organs: Secondary | ICD-10-CM | POA: Insufficient documentation

## 2023-05-14 DIAGNOSIS — Z1501 Genetic susceptibility to malignant neoplasm of breast: Secondary | ICD-10-CM | POA: Diagnosis not present

## 2023-05-14 DIAGNOSIS — Z803 Family history of malignant neoplasm of breast: Secondary | ICD-10-CM | POA: Diagnosis not present

## 2023-05-14 DIAGNOSIS — C50919 Malignant neoplasm of unspecified site of unspecified female breast: Secondary | ICD-10-CM | POA: Diagnosis not present

## 2023-05-14 DIAGNOSIS — Z90710 Acquired absence of both cervix and uterus: Secondary | ICD-10-CM | POA: Insufficient documentation

## 2023-05-14 DIAGNOSIS — Z9013 Acquired absence of bilateral breasts and nipples: Secondary | ICD-10-CM | POA: Insufficient documentation

## 2023-05-14 DIAGNOSIS — L744 Anhidrosis: Secondary | ICD-10-CM | POA: Insufficient documentation

## 2023-05-14 DIAGNOSIS — Z08 Encounter for follow-up examination after completed treatment for malignant neoplasm: Secondary | ICD-10-CM | POA: Diagnosis not present

## 2023-05-14 DIAGNOSIS — Z1509 Genetic susceptibility to other malignant neoplasm: Secondary | ICD-10-CM

## 2023-05-14 DIAGNOSIS — L409 Psoriasis, unspecified: Secondary | ICD-10-CM | POA: Insufficient documentation

## 2023-05-14 NOTE — Assessment & Plan Note (Signed)
BRCA mutation Completed prophylactic mastectomy and oophorectomy. No new family history of cancer. Regular skin checks due to increased risk of melanoma. -Continue regular skin checks and report any new or changing moles.  Anhidrosis Reports lack of sweating for over a year, leading to heat intolerance and exhaustion. No clear etiology identified. Possible link to psoriasis, an autoimmune condition. -Consider consultation with a rheumatologist for further evaluation.  Psoriasis Mild flare-ups managed with topical cream. -Continue current treatment and monitor for worsening symptoms.  Breast reconstruction dissatisfaction Unhappy with results of breast reconstruction performed by her previous surgeon. No current plans for further procedures. -No further action required at this time.  Follow-up No current health concerns related to BRCA mutation or cancer risk. -Schedule annual follow-up appointment.

## 2023-05-14 NOTE — Progress Notes (Signed)
[facility] CONSULT NOTE  Patient Care Team: Blair Heys, MD (Inactive) as PCP - General (Family Medicine)  CHIEF COMPLAINTS/PURPOSE OF CONSULTATION:  BRCA 2 gene mutation  ASSESSMENT & PLAN:   This is a very pleasant 63 yr old female patient with new diagnosis of breast cancer referred to high risk breast cancer clinic given BRCA 2 mutation.  BRCA positive BRCA mutation Completed prophylactic mastectomy and oophorectomy. No new family history of cancer. Regular skin checks due to increased risk of melanoma. -Continue regular skin checks and report any new or changing moles.  Anhidrosis Reports lack of sweating for over a year, leading to heat intolerance and exhaustion. No clear etiology identified. Possible link to psoriasis, an autoimmune condition. -Consider consultation with a rheumatologist for further evaluation.  Psoriasis Mild flare-ups managed with topical cream. -Continue current treatment and monitor for worsening symptoms.  Breast reconstruction dissatisfaction Unhappy with results of breast reconstruction performed by her previous surgeon. No current plans for further procedures. -No further action required at this time.  Follow-up No current health concerns related to BRCA mutation or cancer risk. -Schedule annual follow-up appointment.  HISTORY OF PRESENTING ILLNESS:   Linda Jacobson 63 y.o. female is here because of BRCA 2 mutation.  She has experienced anhidrosis, or lack of sweating, for over a year, which began before her oophorectomy. When she becomes overheated, especially in the summer, she experiences a tingling sensation on her back and feels unwell and exhausted. Her hands become excessively hot, to the point where she avoids touching her legs. Although the sensation subsides, the exhaustion can persist throughout the day. She manages these symptoms by keeping her house cooler.  She underwent a mastectomy and oophorectomy as  preventive measures due to her BRCA gene status. She is up to date with her skin checks and has had all concerning lesions removed. She has completed breast reconstruction but is not entirely satisfied with the results. Financial constraints prevent further procedures.  She has a history of psoriasis, which has flared up in the last six months to a year. She uses a benzolol cream for management. No joint pains, skin rashes, or hair loss. Breathing, bowel movements, and urination are normal.  Her family history includes a sister with psoriatic arthritis and psoriasis. No family history of lupus or rheumatoid arthritis. No new family history of pancreatic cancer. Rest of the pertinent 10 point ROS reviewed and neg.  MEDICAL HISTORY:  Past Medical History:  Diagnosis Date   Family history of BRCA gene mutation 07/20/2020   Family history of breast cancer    Family history of liver cancer     SURGICAL HISTORY: Past Surgical History:  Procedure Laterality Date   BREAST SURGERY Bilateral    reconstruction / touch up / deip flap   LAPAROSCOPIC HYSTERECTOMY  2008   MASTECTOMY Bilateral 2023   NM RENAL LASIX ([facility] HX)      SOCIAL HISTORY: Social History   Socioeconomic History   Marital status: Married    Spouse name: Not on file   Number of children: Not on file   Years of education: Not on file   Highest education level: Not on file  Occupational History   Not on file  Tobacco Use   Smoking status: Never   Smokeless tobacco: Never  Vaping Use   Vaping status: Never Used  Substance and Sexual Activity   Alcohol use: Yes    Alcohol/week: 1.0 standard drink of alcohol    Types:  1 Glasses of wine per week    Comment: occ   Drug use: Never   Sexual activity: Not on file  Other Topics Concern   Not on file  Social History Narrative   Not on file   Social Drivers of Health   Financial Resource Strain: Not on file  Food Insecurity: No Food Insecurity (08/27/2021)   [facility] prior to 05/24/2022[facility] visits prior to 05/24/2022.   Hunger Vital Sign    Worried About Programme researcher, broadcasting/film/video in the Last Year: Never true    Ran Out of Food in the Last Year: Never true  Transportation Needs: Not on file  Physical Activity: Not on file  Stress: Not on file  Social Connections: Not on file  Intimate Partner Violence: Not on file    FAMILY HISTORY: Family History  Problem Relation Age of Onset   Breast cancer Sister 51   Breast cancer Sister 80   Liver cancer Maternal Uncle        dx >50   Breast cancer Maternal Grandmother        unknown age of diagnosis   BRCA 1/2 Sister        BRCA positive   Breast cancer Paternal Grandmother        unknown age of diagnosis    ALLERGIES:  has no known allergies.  MEDICATIONS:  Current Outpatient Medications  Medication Sig Dispense Refill   cholecalciferol (VITAMIN D3) 25 MCG (1000 UNIT) tablet Take 1,000 Units by mouth daily.     estradiol (ESTRACE) 0.1 MG/GM vaginal cream Place vaginally.     Glucosamine-Chondroitin 500-400 MG CAPS Take 2 capsules by mouth daily.     magnesium 30 MG tablet Take 30 mg by mouth 2 (two) times daily.     omeprazole (PRILOSEC) 20 MG capsule Take 20 mg by mouth daily.     rizatriptan (MAXALT-MLT) 10 MG disintegrating tablet Take 1 tablet (10 mg total) by mouth as needed. May repeat in 2 hours if needed 10 tablet 6   SUMAtriptan (IMITREX) 100 MG tablet Take 100 mg by mouth as directed.     vitamin C (ASCORBIC ACID) 250 MG tablet Take 250 mg by mouth daily.     No current facility-administered medications for this visit.   PHYSICAL EXAMINATION:  ECOG PERFORMANCE STATUS: 0 - Asymptomatic  Vitals:   05/14/23 1501  BP: 127/66  Pulse: 99  Resp: 16  Temp: 97.6 F (36.4 C)  SpO2: 97%    Filed Weights   05/14/23 1501  Weight: 159 lb 9.6 oz (72.4 kg)   GENERAL:alert, no distress and  comfortable Breast : Bilateral breast with reconstruction, good cosmetic outcome so far. No palpable masses or regional adenopathy Chest: CTA bilaterally Heart: RRR Abdomen: soft,non tender. Extremities: No edema.  LABORATORY DATA:  I have reviewed the data as listed Lab Results  Component Value Date   WBC 5.6 04/16/2023   HGB 13.7 04/16/2023   HCT 40.0 04/16/2023   MCV 94 04/16/2023   PLT 220 04/16/2023     Chemistry      Component Value Date/Time   NA 143 04/16/2023 1535   K 4.1 04/16/2023 1535   CL 102 04/16/2023 1535   CO2 25 04/16/2023 1535   BUN 19 04/16/2023 1535   CREATININE 0.66 04/16/2023 1535      Component Value Date/Time   CALCIUM 9.7 04/16/2023 1535   ALKPHOS  80 04/16/2023 1535   AST 18 04/16/2023 1535   ALT 19 04/16/2023 1535   BILITOT 0.3 04/16/2023 1535       RADIOGRAPHIC STUDIES: I have personally reviewed the radiological images as listed and agreed with the findings in the report. No results found.  All questions were answered. The patient knows to call the clinic with any problems, questions or concerns. I spent 30 minutes in the care of this patient including history, review of records, counseling and coordination of care. We have once again discussed about screening recommendations with BRCA2.   Rachel Moulds, MD 05/14/2023 3:53 PM

## 2023-05-28 DIAGNOSIS — N393 Stress incontinence (female) (male): Secondary | ICD-10-CM | POA: Diagnosis not present

## 2023-05-28 DIAGNOSIS — R3121 Asymptomatic microscopic hematuria: Secondary | ICD-10-CM | POA: Diagnosis not present

## 2023-06-04 DIAGNOSIS — F4322 Adjustment disorder with anxiety: Secondary | ICD-10-CM | POA: Diagnosis not present

## 2023-07-02 DIAGNOSIS — F4322 Adjustment disorder with anxiety: Secondary | ICD-10-CM | POA: Diagnosis not present

## 2023-07-20 DIAGNOSIS — F4322 Adjustment disorder with anxiety: Secondary | ICD-10-CM | POA: Diagnosis not present

## 2023-08-26 DIAGNOSIS — Z421 Encounter for breast reconstruction following mastectomy: Secondary | ICD-10-CM | POA: Diagnosis not present

## 2023-09-07 DIAGNOSIS — F4322 Adjustment disorder with anxiety: Secondary | ICD-10-CM | POA: Diagnosis not present

## 2023-10-08 DIAGNOSIS — F4322 Adjustment disorder with anxiety: Secondary | ICD-10-CM | POA: Diagnosis not present

## 2023-11-11 DIAGNOSIS — F4322 Adjustment disorder with anxiety: Secondary | ICD-10-CM | POA: Diagnosis not present

## 2023-12-23 DIAGNOSIS — F4322 Adjustment disorder with anxiety: Secondary | ICD-10-CM | POA: Diagnosis not present

## 2024-01-25 DIAGNOSIS — R7309 Other abnormal glucose: Secondary | ICD-10-CM | POA: Diagnosis not present

## 2024-01-25 DIAGNOSIS — Z13220 Encounter for screening for lipoid disorders: Secondary | ICD-10-CM | POA: Diagnosis not present

## 2024-01-25 DIAGNOSIS — G43909 Migraine, unspecified, not intractable, without status migrainosus: Secondary | ICD-10-CM | POA: Diagnosis not present

## 2024-01-25 DIAGNOSIS — R932 Abnormal findings on diagnostic imaging of liver and biliary tract: Secondary | ICD-10-CM | POA: Diagnosis not present

## 2024-01-25 DIAGNOSIS — R319 Hematuria, unspecified: Secondary | ICD-10-CM | POA: Diagnosis not present

## 2024-01-25 DIAGNOSIS — Z0189 Encounter for other specified special examinations: Secondary | ICD-10-CM | POA: Diagnosis not present

## 2024-01-25 DIAGNOSIS — R202 Paresthesia of skin: Secondary | ICD-10-CM | POA: Diagnosis not present

## 2024-01-28 DIAGNOSIS — F4322 Adjustment disorder with anxiety: Secondary | ICD-10-CM | POA: Diagnosis not present

## 2024-02-04 ENCOUNTER — Other Ambulatory Visit: Payer: Self-pay | Admitting: Family Medicine

## 2024-02-04 DIAGNOSIS — R932 Abnormal findings on diagnostic imaging of liver and biliary tract: Secondary | ICD-10-CM

## 2024-02-22 ENCOUNTER — Inpatient Hospital Stay: Admission: RE | Admit: 2024-02-22 | Discharge: 2024-02-22 | Attending: Family Medicine | Admitting: Family Medicine

## 2024-02-22 ENCOUNTER — Ambulatory Visit
Admission: RE | Admit: 2024-02-22 | Discharge: 2024-02-22 | Disposition: A | Source: Ambulatory Visit | Attending: Family Medicine | Admitting: Family Medicine

## 2024-02-22 DIAGNOSIS — R932 Abnormal findings on diagnostic imaging of liver and biliary tract: Secondary | ICD-10-CM

## 2024-02-22 DIAGNOSIS — G96191 Perineural cyst: Secondary | ICD-10-CM | POA: Diagnosis not present

## 2024-02-22 DIAGNOSIS — K7689 Other specified diseases of liver: Secondary | ICD-10-CM | POA: Diagnosis not present

## 2024-02-22 DIAGNOSIS — N281 Cyst of kidney, acquired: Secondary | ICD-10-CM | POA: Diagnosis not present

## 2024-02-22 MED ORDER — GADOPICLENOL 0.5 MMOL/ML IV SOLN
7.0000 mL | Freq: Once | INTRAVENOUS | Status: AC | PRN
  Administered 2024-02-22: 7 mL via INTRAVENOUS

## 2024-05-18 ENCOUNTER — Inpatient Hospital Stay: Payer: BC Managed Care – PPO | Admitting: Hematology and Oncology
